# Patient Record
Sex: Male | Born: 1970 | Hispanic: No | Marital: Married | Smoking: Never smoker
Health system: Southern US, Community
[De-identification: ages and names within clinical notes are randomized; demographics above are authoritative.]

## PROBLEM LIST (undated history)

## (undated) DIAGNOSIS — R569 Unspecified convulsions: Secondary | ICD-10-CM

## (undated) DIAGNOSIS — E119 Type 2 diabetes mellitus without complications: Secondary | ICD-10-CM

## (undated) DIAGNOSIS — J449 Chronic obstructive pulmonary disease, unspecified: Secondary | ICD-10-CM

---

## 2020-02-26 DIAGNOSIS — J9621 Acute and chronic respiratory failure with hypoxia: Secondary | ICD-10-CM

## 2020-02-26 DIAGNOSIS — I479 Paroxysmal tachycardia, unspecified: Secondary | ICD-10-CM

## 2020-02-26 DIAGNOSIS — Z930 Tracheostomy status: Secondary | ICD-10-CM

## 2020-02-26 DIAGNOSIS — G9341 Metabolic encephalopathy: Secondary | ICD-10-CM

## 2020-02-26 DIAGNOSIS — R6520 Severe sepsis without septic shock: Secondary | ICD-10-CM

## 2020-02-27 DIAGNOSIS — G9341 Metabolic encephalopathy: Secondary | ICD-10-CM

## 2020-02-27 DIAGNOSIS — I479 Paroxysmal tachycardia, unspecified: Secondary | ICD-10-CM

## 2020-02-27 DIAGNOSIS — R6520 Severe sepsis without septic shock: Secondary | ICD-10-CM

## 2020-02-27 DIAGNOSIS — Z930 Tracheostomy status: Secondary | ICD-10-CM

## 2020-02-27 DIAGNOSIS — J9621 Acute and chronic respiratory failure with hypoxia: Secondary | ICD-10-CM

## 2020-02-28 DIAGNOSIS — I479 Paroxysmal tachycardia, unspecified: Secondary | ICD-10-CM

## 2020-02-28 DIAGNOSIS — Z930 Tracheostomy status: Secondary | ICD-10-CM

## 2020-02-28 DIAGNOSIS — G9341 Metabolic encephalopathy: Secondary | ICD-10-CM

## 2020-02-28 DIAGNOSIS — R6520 Severe sepsis without septic shock: Secondary | ICD-10-CM

## 2020-02-28 DIAGNOSIS — J9621 Acute and chronic respiratory failure with hypoxia: Secondary | ICD-10-CM

## 2020-02-29 DIAGNOSIS — G9341 Metabolic encephalopathy: Secondary | ICD-10-CM

## 2020-02-29 DIAGNOSIS — R6520 Severe sepsis without septic shock: Secondary | ICD-10-CM

## 2020-02-29 DIAGNOSIS — I479 Paroxysmal tachycardia, unspecified: Secondary | ICD-10-CM

## 2020-02-29 DIAGNOSIS — J9621 Acute and chronic respiratory failure with hypoxia: Secondary | ICD-10-CM

## 2020-02-29 DIAGNOSIS — Z930 Tracheostomy status: Secondary | ICD-10-CM

## 2020-03-01 DIAGNOSIS — Z930 Tracheostomy status: Secondary | ICD-10-CM

## 2020-03-01 DIAGNOSIS — R6520 Severe sepsis without septic shock: Secondary | ICD-10-CM

## 2020-03-01 DIAGNOSIS — J9621 Acute and chronic respiratory failure with hypoxia: Secondary | ICD-10-CM

## 2020-03-01 DIAGNOSIS — I479 Paroxysmal tachycardia, unspecified: Secondary | ICD-10-CM

## 2020-03-01 DIAGNOSIS — G9341 Metabolic encephalopathy: Secondary | ICD-10-CM

## 2020-03-02 DIAGNOSIS — Z930 Tracheostomy status: Secondary | ICD-10-CM

## 2020-03-02 DIAGNOSIS — R6520 Severe sepsis without septic shock: Secondary | ICD-10-CM

## 2020-03-02 DIAGNOSIS — I479 Paroxysmal tachycardia, unspecified: Secondary | ICD-10-CM

## 2020-03-02 DIAGNOSIS — J9621 Acute and chronic respiratory failure with hypoxia: Secondary | ICD-10-CM

## 2020-03-02 DIAGNOSIS — G9341 Metabolic encephalopathy: Secondary | ICD-10-CM

## 2020-03-10 DIAGNOSIS — Z930 Tracheostomy status: Secondary | ICD-10-CM

## 2020-03-10 DIAGNOSIS — I479 Paroxysmal tachycardia, unspecified: Secondary | ICD-10-CM

## 2020-03-10 DIAGNOSIS — R6520 Severe sepsis without septic shock: Secondary | ICD-10-CM

## 2020-03-10 DIAGNOSIS — J9621 Acute and chronic respiratory failure with hypoxia: Secondary | ICD-10-CM

## 2020-03-10 DIAGNOSIS — G9341 Metabolic encephalopathy: Secondary | ICD-10-CM

## 2020-03-11 DIAGNOSIS — J9621 Acute and chronic respiratory failure with hypoxia: Secondary | ICD-10-CM

## 2020-03-11 DIAGNOSIS — I479 Paroxysmal tachycardia, unspecified: Secondary | ICD-10-CM

## 2020-03-11 DIAGNOSIS — G9341 Metabolic encephalopathy: Secondary | ICD-10-CM

## 2020-03-11 DIAGNOSIS — Z930 Tracheostomy status: Secondary | ICD-10-CM

## 2020-03-11 DIAGNOSIS — R6520 Severe sepsis without septic shock: Secondary | ICD-10-CM

## 2020-03-12 DIAGNOSIS — R6520 Severe sepsis without septic shock: Secondary | ICD-10-CM | POA: Diagnosis not present

## 2020-03-12 DIAGNOSIS — J9621 Acute and chronic respiratory failure with hypoxia: Secondary | ICD-10-CM | POA: Diagnosis not present

## 2020-03-12 DIAGNOSIS — G9341 Metabolic encephalopathy: Secondary | ICD-10-CM | POA: Diagnosis not present

## 2020-03-12 DIAGNOSIS — Z930 Tracheostomy status: Secondary | ICD-10-CM

## 2020-03-12 DIAGNOSIS — I479 Paroxysmal tachycardia, unspecified: Secondary | ICD-10-CM | POA: Diagnosis not present

## 2020-03-13 DIAGNOSIS — J9621 Acute and chronic respiratory failure with hypoxia: Secondary | ICD-10-CM

## 2020-03-13 DIAGNOSIS — I479 Paroxysmal tachycardia, unspecified: Secondary | ICD-10-CM

## 2020-03-13 DIAGNOSIS — Z930 Tracheostomy status: Secondary | ICD-10-CM

## 2020-03-13 DIAGNOSIS — G9341 Metabolic encephalopathy: Secondary | ICD-10-CM

## 2020-03-13 DIAGNOSIS — R6520 Severe sepsis without septic shock: Secondary | ICD-10-CM

## 2020-10-26 ENCOUNTER — Emergency Department: Payer: Medicaid Other

## 2020-10-26 ENCOUNTER — Other Ambulatory Visit: Payer: Self-pay

## 2020-10-26 ENCOUNTER — Inpatient Hospital Stay
Admission: EM | Admit: 2020-10-26 | Discharge: 2020-10-30 | DRG: 871 | Disposition: A | Discharge status: Skilled Nursing Facility | Payer: Medicaid Other | Attending: Internal Medicine | Admitting: Internal Medicine

## 2020-10-26 ENCOUNTER — Encounter: Payer: Self-pay | Admitting: Emergency Medicine

## 2020-10-26 DIAGNOSIS — Z20822 Contact with and (suspected) exposure to covid-19: Secondary | ICD-10-CM | POA: Diagnosis present

## 2020-10-26 DIAGNOSIS — R6520 Severe sepsis without septic shock: Secondary | ICD-10-CM | POA: Diagnosis present

## 2020-10-26 DIAGNOSIS — L03111 Cellulitis of right axilla: Secondary | ICD-10-CM | POA: Diagnosis present

## 2020-10-26 DIAGNOSIS — Z6820 Body mass index (BMI) 20.0-20.9, adult: Secondary | ICD-10-CM

## 2020-10-26 DIAGNOSIS — R627 Adult failure to thrive: Secondary | ICD-10-CM | POA: Diagnosis present

## 2020-10-26 DIAGNOSIS — N3001 Acute cystitis with hematuria: Secondary | ICD-10-CM | POA: Diagnosis present

## 2020-10-26 DIAGNOSIS — Z9884 Bariatric surgery status: Secondary | ICD-10-CM

## 2020-10-26 DIAGNOSIS — L89150 Pressure ulcer of sacral region, unstageable: Secondary | ICD-10-CM | POA: Diagnosis present

## 2020-10-26 DIAGNOSIS — L732 Hidradenitis suppurativa: Secondary | ICD-10-CM | POA: Diagnosis present

## 2020-10-26 DIAGNOSIS — E440 Moderate protein-calorie malnutrition: Secondary | ICD-10-CM | POA: Diagnosis present

## 2020-10-26 DIAGNOSIS — D649 Anemia, unspecified: Secondary | ICD-10-CM

## 2020-10-26 DIAGNOSIS — K9423 Gastrostomy malfunction: Secondary | ICD-10-CM | POA: Diagnosis present

## 2020-10-26 DIAGNOSIS — N3090 Cystitis, unspecified without hematuria: Secondary | ICD-10-CM

## 2020-10-26 DIAGNOSIS — E1165 Type 2 diabetes mellitus with hyperglycemia: Secondary | ICD-10-CM | POA: Diagnosis not present

## 2020-10-26 DIAGNOSIS — Z7401 Bed confinement status: Secondary | ICD-10-CM

## 2020-10-26 DIAGNOSIS — L03119 Cellulitis of unspecified part of limb: Secondary | ICD-10-CM

## 2020-10-26 DIAGNOSIS — T85598A Other mechanical complication of other gastrointestinal prosthetic devices, implants and grafts, initial encounter: Secondary | ICD-10-CM | POA: Diagnosis present

## 2020-10-26 DIAGNOSIS — L89154 Pressure ulcer of sacral region, stage 4: Secondary | ICD-10-CM | POA: Diagnosis present

## 2020-10-26 DIAGNOSIS — L8990 Pressure ulcer of unspecified site, unspecified stage: Secondary | ICD-10-CM | POA: Insufficient documentation

## 2020-10-26 DIAGNOSIS — D72829 Elevated white blood cell count, unspecified: Secondary | ICD-10-CM | POA: Diagnosis present

## 2020-10-26 DIAGNOSIS — G40909 Epilepsy, unspecified, not intractable, without status epilepticus: Secondary | ICD-10-CM | POA: Diagnosis present

## 2020-10-26 DIAGNOSIS — I959 Hypotension, unspecified: Secondary | ICD-10-CM | POA: Diagnosis not present

## 2020-10-26 DIAGNOSIS — A419 Sepsis, unspecified organism: Principal | ICD-10-CM | POA: Diagnosis present

## 2020-10-26 DIAGNOSIS — J449 Chronic obstructive pulmonary disease, unspecified: Secondary | ICD-10-CM | POA: Diagnosis present

## 2020-10-26 DIAGNOSIS — L03112 Cellulitis of left axilla: Secondary | ICD-10-CM | POA: Diagnosis present

## 2020-10-26 DIAGNOSIS — K219 Gastro-esophageal reflux disease without esophagitis: Secondary | ICD-10-CM | POA: Diagnosis present

## 2020-10-26 DIAGNOSIS — R6889 Other general symptoms and signs: Secondary | ICD-10-CM | POA: Diagnosis present

## 2020-10-26 DIAGNOSIS — S31000A Unspecified open wound of lower back and pelvis without penetration into retroperitoneum, initial encounter: Secondary | ICD-10-CM

## 2020-10-26 HISTORY — DX: Type 2 diabetes mellitus without complications: E11.9

## 2020-10-26 HISTORY — DX: Chronic obstructive pulmonary disease, unspecified: J44.9

## 2020-10-26 HISTORY — DX: Unspecified convulsions: R56.9

## 2020-10-26 LAB — COMPREHENSIVE METABOLIC PANEL
ALT: 80 U/L — ABNORMAL HIGH (ref 0–44)
AST: 34 U/L (ref 15–41)
Albumin: 3.1 g/dL — ABNORMAL LOW (ref 3.5–5.0)
Alkaline Phosphatase: 152 U/L — ABNORMAL HIGH (ref 38–126)
Anion gap: 7 (ref 5–15)
BUN: 75 mg/dL — ABNORMAL HIGH (ref 6–20)
CO2: 31 mmol/L (ref 22–32)
Calcium: 10 mg/dL (ref 8.9–10.3)
Chloride: 100 mmol/L (ref 98–111)
Creatinine, Ser: 0.83 mg/dL (ref 0.61–1.24)
GFR, Estimated: >60 mL/min (ref >60)
Glucose, Bld: 183 mg/dL — ABNORMAL HIGH (ref 70–99)
Potassium: 4.5 mmol/L (ref 3.5–5.1)
Sodium: 138 mmol/L (ref 135–145)
Total Bilirubin: 0.4 mg/dL (ref 0.3–1.2)
Total Protein: 8.9 g/dL — ABNORMAL HIGH (ref 6.5–8.1)

## 2020-10-26 LAB — CBC WITH DIFFERENTIAL/PLATELET
Abs Immature Granulocytes: 0.06 10*3/uL (ref 0.00–0.07)
Basophils Absolute: 0.1 10*3/uL (ref 0.0–0.1)
Basophils Relative: 0 %
Eosinophils Absolute: 0.3 10*3/uL (ref 0.0–0.5)
Eosinophils Relative: 2 %
HCT: 29.6 % — ABNORMAL LOW (ref 39.0–52.0)
Hemoglobin: 9.4 g/dL — ABNORMAL LOW (ref 13.0–17.0)
Immature Granulocytes: 1 %
Lymphocytes Relative: 14 %
Lymphs Abs: 1.7 10*3/uL (ref 0.7–4.0)
MCH: 27.5 pg (ref 26.0–34.0)
MCHC: 31.8 g/dL (ref 30.0–36.0)
MCV: 86.5 fL (ref 80.0–100.0)
Monocytes Absolute: 0.7 10*3/uL (ref 0.1–1.0)
Monocytes Relative: 6 %
Neutro Abs: 9.2 10*3/uL — ABNORMAL HIGH (ref 1.7–7.7)
Neutrophils Relative %: 77 %
Platelets: 243 10*3/uL (ref 150–400)
RBC: 3.42 MIL/uL — ABNORMAL LOW (ref 4.22–5.81)
RDW: 16 % — ABNORMAL HIGH (ref 11.5–15.5)
WBC: 12.1 10*3/uL — ABNORMAL HIGH (ref 4.0–10.5)
nRBC: 0 % (ref 0.0–0.2)

## 2020-10-26 LAB — APTT: aPTT: 31 seconds (ref 24–36)

## 2020-10-26 LAB — PROTIME-INR
INR: 1.1 (ref 0.8–1.2)
Prothrombin Time: 14.5 seconds (ref 11.4–15.2)

## 2020-10-26 LAB — CBG MONITORING, ED: Glucose-Capillary: 194 mg/dL — ABNORMAL HIGH (ref 70–99)

## 2020-10-26 LAB — URINALYSIS, COMPLETE (UACMP) WITH MICROSCOPIC
Bacteria, UA: NONE SEEN
Bilirubin Urine: NEGATIVE
Glucose, UA: NEGATIVE mg/dL
Ketones, ur: NEGATIVE mg/dL
Nitrite: NEGATIVE
Protein, ur: 30 mg/dL — AB
Specific Gravity, Urine: 1.015 (ref 1.005–1.030)
WBC, UA: >50 WBC/hpf — ABNORMAL HIGH (ref 0–5)
pH: 8 (ref 5.0–8.0)

## 2020-10-26 LAB — PROCALCITONIN: Procalcitonin: <0.1 ng/mL

## 2020-10-26 LAB — RESP PANEL BY RT-PCR (FLU A&B, COVID) ARPGX2
Influenza A by PCR: NEGATIVE
Influenza B by PCR: NEGATIVE
SARS Coronavirus 2 by RT PCR: NEGATIVE

## 2020-10-26 LAB — TSH: TSH: 2.833 u[IU]/mL (ref 0.350–4.500)

## 2020-10-26 LAB — LACTIC ACID, PLASMA: Lactic Acid, Venous: 1.1 mmol/L (ref 0.5–1.9)

## 2020-10-26 MED ORDER — MELATONIN 5 MG PO TABS
2.5000 mg | ORAL_TABLET | Freq: Every day | ORAL | Status: DC
  Administered 2020-10-26 – 2020-10-29 (×4): 2.5 mg
  Filled 2020-10-26 (×5): qty 1

## 2020-10-26 MED ORDER — MELATONIN 3 MG PO TABS
3.0000 mg | ORAL_TABLET | Freq: Every day | ORAL | Status: DC
  Filled 2020-10-26: qty 1

## 2020-10-26 MED ORDER — SODIUM BICARBONATE 650 MG PO TABS
650.0000 mg | ORAL_TABLET | Freq: Once | ORAL | Status: AC
  Administered 2020-10-26: 650 mg
  Filled 2020-10-26: qty 1

## 2020-10-26 MED ORDER — SODIUM CHLORIDE 0.9 % IV SOLN
2.0000 g | Freq: Once | INTRAVENOUS | Status: AC
  Administered 2020-10-26: 2 g via INTRAVENOUS
  Filled 2020-10-26: qty 2

## 2020-10-26 MED ORDER — LEVETIRACETAM 100 MG/ML PO SOLN
1000.0000 mg | Freq: Two times a day (BID) | ORAL | Status: DC
  Administered 2020-10-26 – 2020-10-30 (×8): 1000 mg
  Filled 2020-10-26 (×9): qty 10

## 2020-10-26 MED ORDER — THIAMINE HCL 100 MG PO TABS
100.0000 mg | ORAL_TABLET | Freq: Every day | ORAL | Status: DC
  Administered 2020-10-27: 100 mg via ORAL
  Filled 2020-10-26: qty 1

## 2020-10-26 MED ORDER — LEVETIRACETAM 500 MG PO TABS
1000.0000 mg | ORAL_TABLET | Freq: Two times a day (BID) | ORAL | Status: DC
  Filled 2020-10-26 (×2): qty 2

## 2020-10-26 MED ORDER — FAMOTIDINE 20 MG PO TABS
20.0000 mg | ORAL_TABLET | Freq: Every day | ORAL | Status: DC
  Administered 2020-10-27 – 2020-10-30 (×4): 20 mg
  Filled 2020-10-26 (×4): qty 1

## 2020-10-26 MED ORDER — CYANOCOBALAMIN 1000 MCG/ML IJ SOLN
1000.0000 ug | INTRAMUSCULAR | Status: DC
  Administered 2020-10-27: 1000 ug via INTRAMUSCULAR
  Filled 2020-10-26: qty 1

## 2020-10-26 MED ORDER — ACETAMINOPHEN 650 MG RE SUPP: 650.0000 mg | Freq: Four times a day (QID) | RECTAL | Status: DC | PRN

## 2020-10-26 MED ORDER — ENOXAPARIN SODIUM 40 MG/0.4ML ~~LOC~~ SOLN
40.0000 mg | SUBCUTANEOUS | Status: DC
  Administered 2020-10-26 – 2020-10-29 (×4): 40 mg via SUBCUTANEOUS
  Filled 2020-10-26 (×4): qty 0.4

## 2020-10-26 MED ORDER — VANCOMYCIN HCL IN DEXTROSE 1-5 GM/200ML-% IV SOLN
1000.0000 mg | Freq: Two times a day (BID) | INTRAVENOUS | Status: DC
  Administered 2020-10-26: 1000 mg via INTRAVENOUS
  Filled 2020-10-26 (×3): qty 200

## 2020-10-26 MED ORDER — VANCOMYCIN HCL 1250 MG/250ML IV SOLN
1250.0000 mg | Freq: Once | INTRAVENOUS | Status: AC
  Administered 2020-10-26: 1250 mg via INTRAVENOUS
  Filled 2020-10-26: qty 250

## 2020-10-26 MED ORDER — ONDANSETRON HCL 4 MG PO TABS: 4.0000 mg | ORAL_TABLET | Freq: Four times a day (QID) | ORAL | Status: DC | PRN

## 2020-10-26 MED ORDER — LACTATED RINGERS IV BOLUS
1000.0000 mL | Freq: Once | INTRAVENOUS | Status: AC
  Administered 2020-10-26: 1000 mL via INTRAVENOUS

## 2020-10-26 MED ORDER — ACETAMINOPHEN 325 MG PO TABS: 650.0000 mg | ORAL_TABLET | Freq: Four times a day (QID) | ORAL | Status: DC | PRN

## 2020-10-26 MED ORDER — METRONIDAZOLE IN NACL 5-0.79 MG/ML-% IV SOLN
500.0000 mg | Freq: Three times a day (TID) | INTRAVENOUS | Status: DC
  Administered 2020-10-26 – 2020-10-28 (×5): 500 mg via INTRAVENOUS
  Filled 2020-10-26 (×7): qty 100

## 2020-10-26 MED ORDER — LORAZEPAM 2 MG/ML IJ SOLN: 1.0000 mg | INTRAMUSCULAR | Status: DC | PRN

## 2020-10-26 MED ORDER — LACTATED RINGERS IV SOLN: INTRAVENOUS | Status: AC

## 2020-10-26 MED ORDER — SODIUM CHLORIDE 0.9 % IV SOLN
2.0000 g | Freq: Three times a day (TID) | INTRAVENOUS | Status: DC
  Administered 2020-10-26 – 2020-10-28 (×5): 2 g via INTRAVENOUS
  Filled 2020-10-26 (×7): qty 2

## 2020-10-26 MED ORDER — PANCRELIPASE (LIP-PROT-AMYL) 10440-39150 UNITS PO TABS
20880.0000 [IU] | ORAL_TABLET | Freq: Once | ORAL | Status: AC
  Administered 2020-10-26: 20880 [IU]
  Filled 2020-10-26 (×2): qty 2

## 2020-10-26 MED ORDER — ONDANSETRON HCL 4 MG/2ML IJ SOLN: 4.0000 mg | Freq: Four times a day (QID) | INTRAMUSCULAR | Status: DC | PRN

## 2020-10-26 MED ORDER — ACETAMINOPHEN 160 MG/5ML PO SOLN
650.0000 mg | Freq: Four times a day (QID) | ORAL | Status: DC | PRN
  Administered 2020-10-26 – 2020-10-27 (×2): 650 mg via ORAL
  Filled 2020-10-26 (×3): qty 20.3

## 2020-10-26 NOTE — ED Notes (Signed)
Wife at bedside. Attempted to flush g tube without success. Katrinka Blazing, MD aware.

## 2020-10-26 NOTE — ED Notes (Signed)
IV team at bedside 

## 2020-10-26 NOTE — ED Notes (Signed)
G tube still clogged after medication. Katrinka Blazing, MD aware.

## 2020-10-26 NOTE — Progress Notes (Signed)
PHARMACY -  BRIEF ANTIBIOTIC NOTE   Pharmacy has received consult(s) for vancomycin and cefepime from an ED provider.  The patient's profile has been reviewed for ht/wt/allergies/indication/available labs.    One time order(s) placed for:   1) cefepime 2 grams IV x 1  2) vancomycin 1250 mg IV x 1  Further antibiotics/pharmacy consults should be ordered by admitting physician if indicated.                       Thank you, Lowella Bandy 10/26/2020  2:20 PM

## 2020-10-26 NOTE — ED Notes (Signed)
Per Katrinka Blazing, MD patient does not need 2nd set of blood cultures at this time.

## 2020-10-26 NOTE — ED Notes (Signed)
Message sent to pharmacy to get Viokace for g tube.

## 2020-10-26 NOTE — ED Notes (Addendum)
IV attempt by this RN and Katrinka Blazing MD without success. IV team consult placed at this time.

## 2020-10-26 NOTE — ED Provider Notes (Addendum)
[facility] Emergency Department Provider Note  ____________________________________________   Event Date/Time   First MD Initiated Contact with Patient 10/26/20 1203     (approximate)  I have reviewed the triage vital signs and the nursing notes.   HISTORY  Chief Complaint Clogged Feeding Tube   HPI Evan Good is a 50 y.o. male with a past medical history of COPD, DM, seizure disorder and anoxic brain injury nonverbal at baseline T2DM,pyoderma gangrenosum,chronic ulcers,lower extremity sacral decubitus ulcer, s/p gastric bypass, hypoxic brain injury s/p PEG tube and tracheostomy placement 02/01/20 (decannulated 05/14/20) who presents via EMS from nursing facility for assessment of clogged G-tube and low blood pressures.  Patient unable to find any history on arrival as he is unable to state 1 or 2 words but otherwise is nonverbal.  Per EMS his sugars were in the 300s and he was hypotensive with systolics in the 80s but they were unable to establish an IV prior to arrival.  He is reportedly at his neurological baseline.      Past Medical History:  Diagnosis Date  . COPD (chronic obstructive pulmonary disease) (HCC)   . Diabetes mellitus without complication (HCC)   . Seizures (HCC)     There are no problems to display for this patient.   History reviewed. No pertinent surgical history.  Prior to Admission medications   Not on File    Allergies Patient has no known allergies.  No family history on file.  Social History Social History   Tobacco Use  . Smoking status: Never Smoker  Vaping Use  . Vaping Use: Never used  Substance Use Topics  . Alcohol use: Not Currently  . Drug use: Not Currently    Review of Systems  Review of Systems  Unable to perform ROS: Patient nonverbal      ____________________________________________   PHYSICAL EXAM:  VITAL SIGNS: ED Triage Vitals  Enc Vitals Group     BP      Pulse       Resp      Temp      Temp src      SpO2      Weight      Height      Head Circumference      Peak Flow      Pain Score      Pain Loc      Pain Edu?      Excl. in GC?    Vitals:   10/26/20 1430 10/26/20 1530  BP: 118/70 118/70  Pulse: 93 93  Resp: 12 13  Temp:    SpO2: 100% 100%   Physical Exam Vitals and nursing note reviewed.  Constitutional:      General: He is in acute distress.     Appearance: He is ill-appearing.  HENT:     Head: Normocephalic and atraumatic.     Right Ear: External ear normal.     Left Ear: External ear normal.     Nose: Nose normal.     Mouth/Throat:     Mouth: Mucous membranes are dry.  Cardiovascular:     Pulses: Normal pulses.  Pulmonary:     Effort: Pulmonary effort is normal. No respiratory distress.     Breath sounds: No stridor. No wheezing, rhonchi or rales.  Chest:     Chest wall: No tenderness.  Abdominal:     General: Abdomen is flat.  Skin:    General: Skin is warm.  Capillary Refill: Capillary refill takes 2 to 3 seconds.  Neurological:     Mental Status: Mental status is at baseline.     GCS: GCS eye subscore is 4. GCS verbal subscore is 2.     Comments: Patient arms are held fairly rigidly at the sides he does not move his lower extremities on command or move his toes with noxious stimuli     Some skin breakdown to the subcu fat with some purulence in the bilateral axilla.  Stage IV sacral decubitus ulcer with some surrounding necrotic tissue but no gross purulence.  Extremities otherwise unremarkable.  G-tube is in place and surrounding skin is unremarkable.  Lungs clear bilaterally.  Patient is able to track examiner with his eyes and shouts curse words indiscriminately does not answer any questions or otherwise participate in exam.  This is baseline per report. ____________________________________________   LABS (all labs ordered are listed, but only abnormal results are displayed)  Labs Reviewed   COMPREHENSIVE METABOLIC PANEL - Abnormal; Notable for the following components:      Result Value   Glucose, Bld 183 (*)    BUN 75 (*)    Total Protein 8.9 (*)    Albumin 3.1 (*)    ALT 80 (*)    Alkaline Phosphatase 152 (*)    All other components within normal limits  CBC WITH DIFFERENTIAL/PLATELET - Abnormal; Notable for the following components:   WBC 12.1 (*)    RBC 3.42 (*)    Hemoglobin 9.4 (*)    HCT 29.6 (*)    RDW 16.0 (*)    Neutro Abs 9.2 (*)    All other components within normal limits  URINALYSIS, COMPLETE (UACMP) WITH MICROSCOPIC - Abnormal; Notable for the following components:   Color, Urine YELLOW (*)    APPearance CLOUDY (*)    Hgb urine dipstick MODERATE (*)    Protein, ur 30 (*)    Leukocytes,Ua LARGE (*)    WBC, UA >50 (*)    All other components within normal limits  CBG MONITORING, ED - Abnormal; Notable for the following components:   Glucose-Capillary 194 (*)    All other components within normal limits  URINE CULTURE  CULTURE, BLOOD (ROUTINE X 2)  CULTURE, BLOOD (ROUTINE X 2)  RESP PANEL BY RT-PCR (FLU A&B, COVID) ARPGX2  LACTIC ACID, PLASMA  PROTIME-INR  APTT  PROCALCITONIN   ____________________________________________  EKG  Sinus rhythm with a ventricular of 94, normal axis, unremarkable intervals and some artifact in lead I and lead II without any clear evidence of acute ischemia or significant underlying arrhythmia. ____________________________________________  RADIOLOGY  ED MD interpretation: No clear evidence of pneumonia on chest x-ray without full consolidation, effusion, significant edema or other clear acute thoracic process.  CT abdomen pelvis remarkable for small amount of subcu gas along the right buttocks and again consistent with cellulitis.  There is also some evidence of constipation and circumferential thickening of the wall of the rectum somewhat nonspecific.  There is evidence of coronary artery disease and a  nonobstructing right-sided kidney stone.  There is diffuse thickening of the bladder with straight cath consistent with recent and out catheterization.  Official radiology report(s): CT ABDOMEN PELVIS WO CONTRAST  Result Date: 10/26/2020 CLINICAL DATA:  Clogged feeding tube. Rash along the right armpit. Buttock ulceration. Nonlocalized abdominal pain. Hyperglycemia. EXAM: CT ABDOMEN AND PELVIS WITHOUT CONTRAST TECHNIQUE: Multidetector CT imaging of the abdomen and pelvis was performed following the standard protocol without IV contrast.  COMPARISON:  None. FINDINGS: Lower chest: Mild airway thickening in both lower lobes with linear subsegmental atelectasis or scarring in both lower lobes. Right coronary artery atherosclerotic calcification. Hepatobiliary: Nonvisualization of the gallbladder, compatible with gallbladder contraction or prior cholecystectomy. No definite biliary dilatation. Pancreas: Unremarkable Spleen: Unremarkable Adrenals/Urinary Tract: 2 mm right kidney nonobstructive renal calculus. Diffuse wall thickening in the urinary bladder with a trace amount of gas in the urinary bladder. No appreciable hydronephrosis or hydroureter. Stomach/Bowel: Postoperative findings favoring gastric bypass. Peg type tube possibly in a jejunostomy pouch. Correlate with operative history. Prominent stool throughout the colon favors constipation. Circumferential wall thickening in the lower rectum, nonspecific. Potential wall thickening in nondistended portions of the sigmoid colon for example in the right abdomen on image 32 series 5. Vascular/Lymphatic: Bilateral common iliac artery atherosclerotic calcification. Right inguinal node 1.5 cm in short axis on image 91 series 2. Left inguinal lymph node 1.3 cm in short axis on image 89 series 2. Other inguinal lymph nodes in the sub size range are present. Right external iliac node 0.8 cm in short axis on image 78 series 2. Left external iliac node with 0.9 cm in  short axis, image 76 series 2. Left periaortic lymph node 1.0 cm in short axis, image 39 series 2. Reproductive: Unremarkable Other: Diffuse subcutaneous and mesenteric edema. Presacral stranding. Musculoskeletal: Truncated coccygeal segments on image 68 series 6 with surrounding stranding, significance uncertain but inflammation in this vicinity is not excluded. There seem to be small locules of gas along the cutaneous or immediately subcutaneous surface along the right lower back, correlate with any ulceration in this region. IMPRESSION: 1. Small amount of subcutaneous gas along the right upper buttock/lower back region with cutaneous and subcutaneous thickening suggesting cellulitis/infection. This extends down to the sacrococcygeal margin with some truncation of the lower coccyx such that chronic infection/chronic osteomyelitis cannot be excluded. Presacral edema is noted in this region. 2. Peg tube in a pouchlike bowel loop presumably a jejunostomy pouch. Postoperative findings favoring gastric bypass. Correlate with operative history. 3.  Prominent stool throughout the colon favors constipation. 4. Circumferential wall thickening in the lower rectum and in parts of the sigmoid colon, nonspecific. 5. Mild right inguinal adenopathy with upper normal sized left inguinal and external iliac nodes, possibly reactive but technically nonspecific. 6. Airway thickening is present, suggesting bronchitis or reactive airways disease. 7. Right coronary artery atherosclerosis. 8. Nonobstructive right nephrolithiasis. 9. Diffuse wall thickening in the urinary bladder with a trace amount of gas. The gas might possibly be from recent catheterization, correlate with patient history. The urinary bladder wall thickening is suspicious for cystitis. 10. Diffuse subcutaneous and mesenteric edema suggesting third spacing of fluid. Electronically Signed   By: Gaylyn Rong M.D.   On: 10/26/2020 15:38   DG Chest Port 1  View  Result Date: 10/26/2020 CLINICAL DATA:  Clogged feeding tube. On antibiotics for axillary rash. Evaluate for possible sepsis. EXAM: PORTABLE CHEST 1 VIEW COMPARISON:  None FINDINGS: Single frontal view of the chest. The left hand and wrist project over the lower left chest. Patient is rotated to the left. Normal heart size. No pneumothorax. No right-sided pleural fluid. The right lung is clear. The left apex is clear. IMPRESSION: Degraded evaluation of the left hemithorax secondary to patient arm/wrist position. Given this limitation, no evidence of pneumonia or explanation for sepsis. Electronically Signed   By: Jeronimo Greaves M.D.   On: 10/26/2020 13:49    ____________________________________________   PROCEDURES  Procedure(s) performed (  including Critical Care):  Gastrostomy tube replacement  Date/Time: 10/26/2020 3:47 PM Performed by: Gilles Chiquito, MD Authorized by: Gilles Chiquito, MD  Consent given by: power of attorney Local anesthesia used: no  Anesthesia: Local anesthesia used: no  Sedation: Patient sedated: no  Patient tolerance: patient tolerated the procedure well with no immediate complications Comments: Change from 20 [place] to 22  [place] as we do not carry 20 [place] in the ED.This is a J tube not G  .Critical Care Performed by: Gilles Chiquito, MD Authorized by: Gilles Chiquito, MD   Critical care provider statement:    Critical care time (minutes):  45   Critical care was necessary to treat or prevent imminent or life-threatening deterioration of the following conditions:  Sepsis   Critical care was time spent personally by me on the following activities:  Discussions with consultants, evaluation of patient's response to treatment, examination of patient, ordering and performing treatments and interventions, ordering and review of laboratory studies, ordering and review of radiographic studies, pulse oximetry, re-evaluation of patient's condition,  obtaining history from patient or surrogate and review of old charts     ____________________________________________   INITIAL IMPRESSION / ASSESSMENT AND PLAN / ED COURSE        Patient presents with above to history exam with several concerns including clogged G-tube and some low blood pressures.  On arrival patient is noted to be hypotensive with a BP of 87/67 with otherwise stable vital signs on room air.  He has several wounds noted on exam including his bilateral axilla which has some purulent drainage as well as a fairly deep unstageable sacral wound with surrounding discolored tissue but no significant purulence.  With regard to his clogged G-tube attempted to unclog his with protocol enzymes however this was unsuccessful.  This was changed per procedure note above to a 22 [place] G-tube.  This was performed without any difficulty or complication.  With regard to low blood pressure concern for possible dehydration versus sepsis.  No history of recent injuries or falls.  Patient has no fever and his lactic is 1.1 his white blood cell count is elevated at 12.1.  His hemoglobin is 9.4 although looking through his prior records it seems he has chronic anemia with hemoglobins around 10 this does not seem to 4 at baseline.  His urinalysis appears grossly infected with moderate hemoglobin 30 protein and large leukocyte esterase with.  50 WBCs.  This was obtained after a cath sample.  CMP shows a glucose of 183 with no other significant electrolyte or metabolic derangements.  Chest x-ray has no evidence of pneumonia but CT does show findings concerning for cystitis cellulitis from patient's sacral decubitus wounds.  There is also some gas in the bladder which is suspect is from in and out catheterization and tracking along his wounds sacrum which is suspect is likely from chronic cellulitis and osteomyelitis.  Multiple other incidental findings noted above.  Of note the CT was obtained prior to  J-tube replacement.  Given patient was hypotensive on arrival with elevated blood cell count and multiple sites of infection on exam and work-up i.e. bladder infection and concern for possible early or occult sepsis.  He was given 2 L of IV fluid with improvement in his blood pressure.  Was started on broad-spectrum antibiotics and blood and urine cultures were ordered.  Given concern for sepsis I will plan to admit to medicine service for further evaluation and management.  ____________________________________________  FINAL CLINICAL IMPRESSION(S) / ED DIAGNOSES  Final diagnoses:  Sepsis, due to unspecified organism, unspecified whether acute organ dysfunction present [facility])  Acute cystitis with hematuria  Wound of sacral region, initial encounter  Cellulitis of axillary fold  Hypotension, unspecified hypotension type  Anemia, unspecified type  Gastrostomy tube dysfunction (HCC)    Medications  vancomycin (VANCOREADY) IVPB 1250 mg/250 mL (1,250 mg Intravenous New Bag/Given 10/26/20 1526)  lactated ringers bolus 1,000 mL (0 mLs Intravenous Stopped 10/26/20 1432)  lipase/protease/amylase) (VIOKACE) tablets 20,880 Units (20,880 Units Per Tube Given 10/26/20 1339)    And  sodium bicarbonate tablet 650 mg (650 mg Per Tube Given 10/26/20 1339)  ceFEPIme (MAXIPIME) 2 g in sodium chloride 0.9 % 100 mL IVPB (0 g Intravenous Stopped 10/26/20 1458)  lactated ringers bolus 1,000 mL (1,000 mLs Intravenous New Bag/Given 10/26/20 1526)     ED Discharge Orders    None       Note:  This document was prepared using Dragon voice recognition software and may include unintentional dictation errors.   Gilles Chiquito, MD 10/26/20 1557    Gilles Chiquito, MD 10/26/20 [phone]

## 2020-10-26 NOTE — ED Notes (Signed)
Dressing on bottom changed. Patient cleaned and new brief applied.

## 2020-10-26 NOTE — ED Triage Notes (Signed)
Patient arrives from [facility] for a clogged feeding tube. Patient has also had rash on right armpit that he is currently getting antibiotics for. Cbg has been around 350-450 for past few days. Known ulcer on buttocks. Patient is non-verbal at baseline.

## 2020-10-26 NOTE — ED Notes (Signed)
Patient at CT scan.

## 2020-10-26 NOTE — Consult Note (Signed)
Pharmacy Antibiotic Note  Evan Good is a 50 y.o. male admitted on 10/26/2020 with unclear source of infection (c/f cystitis &/or cellulitis from sacral wound).  Pharmacy has been consulted for Vancomycin and Cefepime dosing.  Plan: -- Vancomycin 1250mg  x1 (~19mg /kg); followed by 1g q12h  (~15mg /kg) Predicted AUC 493.7; Cmax 33.1; Cmin 12.8 Scr: 0.83; Vd 0.72; TBW<IBW & est. CrCl 81ml/min (height used was 5'10" from Q4 of 2021)  -- continue with Cefepime  2g q8h  -- Also on Metronidazole 500mg  IV q8H  Weight: 65 kg (143 lb 4.8 oz)  Temp (24hrs), Avg:97.4 F (36.3 C), Min:97.4 F (36.3 C), Max:97.4 F (36.3 C)  Recent Labs  Lab 10/26/20 1220  WBC 12.1*  CREATININE 0.83  LATICACIDVEN 1.1    CrCl cannot be calculated (Unknown ideal weight.).    No Known Allergies  Antimicrobials this admission: Vancomcycin (4/17 >>  Cefepime (4/17 >>  Flagyl (4/17>>  Dose adjustments this admission: n/a  Microbiology results: 4/17 BCx: sent 4/17 UCx: sent  4/17 MRSA PCR: ordered  Thank you for allowing pharmacy to be a part of this patient's care.  5/17 10/26/2020 4:35 PM

## 2020-10-26 NOTE — H&P (Signed)
History and Physical   Michaelle CopasChiemeka Good Louderback WGN:[id number] DOB: [birth year] DOA: 10/26/2020  PCP: Patrecia PourShackleford, Brian Alexander, MD Patient coming from: [place] [facility]  I have personally briefly reviewed patient's old medical records [facility].  Chief Concern: Clogged feeding tube  HPI: Evan Good Doyce Parawangwa is a 50 y.o. male with medical history significant for history of C. 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  Family was not at bedside.  At bedside, patient opens eyes spontaneously, mumbles incoherently occasionally, does track bilaterally with his eyes.  Does not answer questions coherently.  Social history: Unknown  Vaccination status: Unknown  ROS: Unable to complete as patient is nonverbal  ED Course:   Assessment/Plan  Principal Problem:   Sepsis associated hypotension (HCC) Active Problems:   Leukocytosis   Cystitis   Total self-care deficit   Feeding tube blocked   Pressure injury of skin   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)   Meets sepsis criteria with hypotension Source is urinary tract infection versus cellulitis -Elevated respiration rate, hypotensive, WBC -Status post 2 L lactated Ringer's bolus per ED provider -Patient is now maintaining appropriate MAP -Continue LR 100 mL/h, 1 day ordered -Follow blood cultures and urine culture -Status post broad-spectrum antibiotic, cefepime, metronidazole,  vancomycin per ED provider -We will continue these antibiotics -Checking sed rate and CRP -A.m. 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 difficile colitis-low clinical suspicion at this time as patient is not having diarrhea and there is no colitis on CT imaging  Sacral decubitus ulcer-present on admission -In setting of patient chronic with bedbound state and requiring total care -Wound care consult  Requires tube feeding-registered dietitian ordered for enteral/tube feeding initiation and management -I was not able to locate feeding regimen per [facility] E [facility]  Protein and calorie malnutrition Chronic failure to thrive-dietary consulted  Chart reviewed.   DVT prophylaxis: Enoxaparin 40 mg subcutaneous every 24 hours Code Status: Full code, wife says that her husband is a IT sales professionalfighter and would not want to give up Diet: Heart healthy/carb modified Family Communication: Updated spouse, Nettie ElmSylvia over the phone Disposition Plan: Pending clinical course Consults called: None at this time Admission status: Observation to MedSurg, with telemetry  Past Medical History:  Diagnosis Date  . COPD (chronic obstructive pulmonary disease) (HCC)   . Diabetes mellitus without complication (HCC)   . Seizures (HCC)    History reviewed. No pertinent surgical history.  Social History:  reports that he has never smoked. He does not have any smokeless tobacco history on file. He reports previous alcohol use. He reports previous drug  use.  No Known Allergies No family history on file. Family history: Family  history reviewed and not pertinent  Prior to Admission medications   Not on File   Physical Exam: Vitals:   10/26/20 1430 10/26/20 1530 10/26/20 1700 10/26/20 1806  BP: 118/70 118/70 114/69 118/79  Pulse: 93 93 91 91  Resp: 12 13 18 16   Temp:      TempSrc:      SpO2: 100% 100% 100% 100%  Weight:       Constitutional: appears older than chronological age.  Frail, cachectic, failure to thrive Eyes: PERRL, lids and conjunctivae normal ENMT: Mucous membranes are dry. Posterior pharynx clear of any exudate or lesions.  Poor dentition. Hearing appropriate Neck: normal, supple, no masses, no thyromegaly Respiratory: clear to auscultation bilaterally, no wheezing, no crackles. Normal respiratory effort. No accessory muscle use.  Cardiovascular: Regular rate and rhythm, no murmurs / rubs / gallops. No extremity edema. 2+ pedal pulses. No carotid bruits.  Abdomen: Soft, PEG tube in place, no tenderness, no masses palpated, no hepatosplenomegaly. Bowel sounds positive.  Musculoskeletal: no clubbing / cyanosis. No joint deformity upper and lower extremities.  Poor ROM, bilateral upper extremity contractures.  Upper and lower extremity atrophy.  Decreased muscle tones of all extremities. Skin: Bilateral upper axillary rashes, sacral decubitus ulcer Neurologic: Sensation intact. Strength 5/5 in all 4.  Psychiatric: Cannot assess as patient is not able to tell me his name, age, location.  He only spontaneously opens his eyes.  EKG: independently reviewed, showing sinus rhythm with a rate of 94, QTc 444  Chest x-ray on Admission: I personally reviewed and I agree with radiologist reading as below.  CT ABDOMEN PELVIS WO CONTRAST  Result Date: 10/26/2020 CLINICAL DATA:  Clogged feeding tube. Rash along the right armpit. Buttock ulceration. Nonlocalized abdominal pain. Hyperglycemia. EXAM: CT ABDOMEN AND PELVIS WITHOUT  CONTRAST TECHNIQUE: Multidetector CT imaging of the abdomen and pelvis was performed following the standard protocol without IV contrast. COMPARISON:  None. FINDINGS: Lower chest: Mild airway thickening in both lower lobes with linear subsegmental atelectasis or scarring in both lower lobes. Right coronary artery atherosclerotic calcification. Hepatobiliary: Nonvisualization of the gallbladder, compatible with gallbladder contraction or prior cholecystectomy. No definite biliary dilatation. Pancreas: Unremarkable Spleen: Unremarkable Adrenals/Urinary Tract: 2 mm right kidney nonobstructive renal calculus. Diffuse wall thickening in the urinary bladder with a trace amount of gas in the urinary bladder. No appreciable hydronephrosis or hydroureter. Stomach/Bowel: Postoperative findings favoring gastric bypass. Peg type tube possibly in a jejunostomy pouch. Correlate with operative history. Prominent stool throughout the colon favors constipation. Circumferential wall thickening in the lower rectum, nonspecific. Potential wall thickening in nondistended portions of the sigmoid colon for example in the right abdomen on image 32 series 5. Vascular/Lymphatic: Bilateral common iliac artery atherosclerotic calcification. Right inguinal node 1.5 cm in short axis on image 91 series 2. Left inguinal lymph node 1.3 cm in short axis on image 89 series 2. Other inguinal lymph nodes in the sub size range are present. Right external iliac node 0.8 cm in short axis on image 78 series 2. Left external iliac node with 0.9 cm in short axis, image 76 series 2. Left periaortic lymph node 1.0 cm in short axis, image 39 series 2. Reproductive: Unremarkable Other: Diffuse subcutaneous and mesenteric edema. Presacral stranding. Musculoskeletal: Truncated coccygeal segments on image 68 series 6 with surrounding stranding, significance uncertain but inflammation in this vicinity is not excluded. There seem to be small locules of gas along the  cutaneous or immediately subcutaneous surface along the right lower back, correlate with  any ulceration in this region. IMPRESSION: 1. Small amount of subcutaneous gas along the right upper buttock/lower back region with cutaneous and subcutaneous thickening suggesting cellulitis/infection. This extends down to the sacrococcygeal margin with some truncation of the lower coccyx such that chronic infection/chronic osteomyelitis cannot be excluded. Presacral edema is noted in this region. 2. Peg tube in a pouchlike bowel loop presumably a jejunostomy pouch. Postoperative findings favoring gastric bypass. Correlate with operative history. 3.  Prominent stool throughout the colon favors constipation. 4. Circumferential wall thickening in the lower rectum and in parts of the sigmoid colon, nonspecific. 5. Mild right inguinal adenopathy with upper normal sized left inguinal and external iliac nodes, possibly reactive but technically nonspecific. 6. Airway thickening is present, suggesting bronchitis or reactive airways disease. 7. Right coronary artery atherosclerosis. 8. Nonobstructive right nephrolithiasis. 9. Diffuse wall thickening in the urinary bladder with a trace amount of gas. The gas might possibly be from recent catheterization, correlate with patient history. The urinary bladder wall thickening is suspicious for cystitis. 10. Diffuse subcutaneous and mesenteric edema suggesting third spacing of fluid. Electronically Signed   By: Gaylyn Rong M.D.   On: 10/26/2020 15:38   DG Abdomen 1 View  Result Date: 10/26/2020 CLINICAL DATA:  Check jejunostomy catheter placement status post Re-insertion EXAM: ABDOMEN - 1 VIEW COMPARISON:  CT from earlier in the same day. FINDINGS: Jejunostomy catheter is noted. Contrast material injected flows freely into the small bowel. No obstructive changes are noted. IMPRESSION: Jejunostomy catheter in satisfactory position. Electronically Signed   By: Alcide Clever M.D.   On:  10/26/2020 16:35   DG Chest Port 1 View  Result Date: 10/26/2020 CLINICAL DATA:  Clogged feeding tube. On antibiotics for axillary rash. Evaluate for possible sepsis. EXAM: PORTABLE CHEST 1 VIEW COMPARISON:  None FINDINGS: Single frontal view of the chest. The left hand and wrist project over the lower left chest. Patient is rotated to the left. Normal heart size. No pneumothorax. No right-sided pleural fluid. The right lung is clear. The left apex is clear. IMPRESSION: Degraded evaluation of the left hemithorax secondary to patient arm/wrist position. Given this limitation, no evidence of pneumonia or explanation for sepsis. Electronically Signed   By: Jeronimo Greaves M.D.   On: 10/26/2020 13:49   Labs on Admission: I have personally reviewed following labs  CBC: Recent Labs  Lab 10/26/20 1220  WBC 12.1*  NEUTROABS 9.2*  HGB 9.4*  HCT 29.6*  MCV 86.5  PLT 243   Basic Metabolic Panel: Recent Labs  Lab 10/26/20 1220  NA 138  K 4.5  CL 100  CO2 31  GLUCOSE 183*  BUN 75*  CREATININE 0.83  CALCIUM 10.0   GFR: CrCl cannot be calculated (Unknown ideal weight.).  Liver Function Tests: Recent Labs  Lab 10/26/20 1220  AST 34  ALT 80*  ALKPHOS 152*  BILITOT 0.4  PROT 8.9*  ALBUMIN 3.1*   Coagulation Profile: Recent Labs  Lab 10/26/20 1220  INR 1.1   CBG: Recent Labs  Lab 10/26/20 1221  GLUCAP 194*   Lipid Profile: No results for input(s): CHOL, HDL, LDLCALC, TRIG, CHOLHDL, LDLDIRECT in the last 72 hours.  Thyroid Function Tests: Recent Labs    10/26/20 1530  TSH 2.833   Urine analysis:    Component Value Date/Time   COLORURINE YELLOW (A) 10/26/2020 1220   APPEARANCEUR CLOUDY (A) 10/26/2020 1220   LABSPEC 1.015 10/26/2020 1220   PHURINE 8.0 10/26/2020 1220   GLUCOSEU NEGATIVE 10/26/2020 1220  HGBUR MODERATE (A) 10/26/2020 1220   BILIRUBINUR NEGATIVE 10/26/2020 1220   KETONESUR NEGATIVE 10/26/2020 1220   PROTEINUR 30 (A) 10/26/2020 1220   NITRITE  NEGATIVE 10/26/2020 1220   LEUKOCYTESUR LARGE (A) 10/26/2020 1220   Nikita Surman N Nimrod Wendt D.O. [facility]  If 7PM-7AM, please contact overnight-coverage provider If 7AM-7PM, please contact day coverage provider [email or web address]  10/26/2020, 8:21 PM

## 2020-10-27 DIAGNOSIS — E1165 Type 2 diabetes mellitus with hyperglycemia: Secondary | ICD-10-CM | POA: Diagnosis not present

## 2020-10-27 DIAGNOSIS — R627 Adult failure to thrive: Secondary | ICD-10-CM | POA: Diagnosis present

## 2020-10-27 DIAGNOSIS — L732 Hidradenitis suppurativa: Secondary | ICD-10-CM | POA: Diagnosis present

## 2020-10-27 DIAGNOSIS — G40909 Epilepsy, unspecified, not intractable, without status epilepticus: Secondary | ICD-10-CM | POA: Diagnosis present

## 2020-10-27 DIAGNOSIS — R6520 Severe sepsis without septic shock: Secondary | ICD-10-CM | POA: Diagnosis present

## 2020-10-27 DIAGNOSIS — N3001 Acute cystitis with hematuria: Secondary | ICD-10-CM | POA: Diagnosis present

## 2020-10-27 DIAGNOSIS — Z9884 Bariatric surgery status: Secondary | ICD-10-CM | POA: Diagnosis not present

## 2020-10-27 DIAGNOSIS — D649 Anemia, unspecified: Secondary | ICD-10-CM | POA: Diagnosis present

## 2020-10-27 DIAGNOSIS — Z6820 Body mass index (BMI) 20.0-20.9, adult: Secondary | ICD-10-CM | POA: Diagnosis not present

## 2020-10-27 DIAGNOSIS — I959 Hypotension, unspecified: Secondary | ICD-10-CM | POA: Diagnosis present

## 2020-10-27 DIAGNOSIS — E440 Moderate protein-calorie malnutrition: Secondary | ICD-10-CM | POA: Diagnosis present

## 2020-10-27 DIAGNOSIS — L03111 Cellulitis of right axilla: Secondary | ICD-10-CM | POA: Diagnosis present

## 2020-10-27 DIAGNOSIS — L03112 Cellulitis of left axilla: Secondary | ICD-10-CM | POA: Diagnosis present

## 2020-10-27 DIAGNOSIS — Z7401 Bed confinement status: Secondary | ICD-10-CM | POA: Diagnosis not present

## 2020-10-27 DIAGNOSIS — K219 Gastro-esophageal reflux disease without esophagitis: Secondary | ICD-10-CM | POA: Diagnosis present

## 2020-10-27 DIAGNOSIS — L89154 Pressure ulcer of sacral region, stage 4: Secondary | ICD-10-CM | POA: Diagnosis present

## 2020-10-27 DIAGNOSIS — J449 Chronic obstructive pulmonary disease, unspecified: Secondary | ICD-10-CM | POA: Diagnosis present

## 2020-10-27 DIAGNOSIS — K9423 Gastrostomy malfunction: Secondary | ICD-10-CM | POA: Diagnosis present

## 2020-10-27 DIAGNOSIS — A419 Sepsis, unspecified organism: Secondary | ICD-10-CM | POA: Diagnosis present

## 2020-10-27 DIAGNOSIS — R6889 Other general symptoms and signs: Secondary | ICD-10-CM | POA: Diagnosis not present

## 2020-10-27 DIAGNOSIS — Z7189 Other specified counseling: Secondary | ICD-10-CM | POA: Diagnosis not present

## 2020-10-27 DIAGNOSIS — Z20822 Contact with and (suspected) exposure to covid-19: Secondary | ICD-10-CM | POA: Diagnosis present

## 2020-10-27 LAB — CBC
HCT: 26.9 % — ABNORMAL LOW (ref 39.0–52.0)
Hemoglobin: 8.2 g/dL — ABNORMAL LOW (ref 13.0–17.0)
MCH: 26.9 pg (ref 26.0–34.0)
MCHC: 30.5 g/dL (ref 30.0–36.0)
MCV: 88.2 fL (ref 80.0–100.0)
Platelets: 237 10*3/uL (ref 150–400)
RBC: 3.05 MIL/uL — ABNORMAL LOW (ref 4.22–5.81)
RDW: 16.2 % — ABNORMAL HIGH (ref 11.5–15.5)
WBC: 14 10*3/uL — ABNORMAL HIGH (ref 4.0–10.5)
nRBC: 0 % (ref 0.0–0.2)

## 2020-10-27 LAB — PHOSPHORUS: Phosphorus: 4.5 mg/dL (ref 2.5–4.6)

## 2020-10-27 LAB — SEDIMENTATION RATE: Sed Rate: 108 mm/hr — ABNORMAL HIGH (ref 0–15)

## 2020-10-27 LAB — BASIC METABOLIC PANEL
Anion gap: 8 (ref 5–15)
BUN: 56 mg/dL — ABNORMAL HIGH (ref 6–20)
CO2: 27 mmol/L (ref 22–32)
Calcium: 9.3 mg/dL (ref 8.9–10.3)
Chloride: 103 mmol/L (ref 98–111)
Creatinine, Ser: 0.67 mg/dL (ref 0.61–1.24)
GFR, Estimated: >60 mL/min (ref >60)
Glucose, Bld: 178 mg/dL — ABNORMAL HIGH (ref 70–99)
Potassium: 4.6 mmol/L (ref 3.5–5.1)
Sodium: 138 mmol/L (ref 135–145)

## 2020-10-27 LAB — URINE CULTURE: Culture: NO GROWTH

## 2020-10-27 LAB — MAGNESIUM: Magnesium: 2.1 mg/dL (ref 1.7–2.4)

## 2020-10-27 LAB — PROTIME-INR
INR: 1.3 — ABNORMAL HIGH (ref 0.8–1.2)
Prothrombin Time: 15.8 seconds — ABNORMAL HIGH (ref 11.4–15.2)

## 2020-10-27 LAB — HIV ANTIBODY (ROUTINE TESTING W REFLEX): HIV Screen 4th Generation wRfx: NONREACTIVE

## 2020-10-27 LAB — GLUCOSE, CAPILLARY
Glucose-Capillary: 230 mg/dL — ABNORMAL HIGH (ref 70–99)
Glucose-Capillary: 276 mg/dL — ABNORMAL HIGH (ref 70–99)

## 2020-10-27 LAB — PROCALCITONIN: Procalcitonin: <0.1 ng/mL

## 2020-10-27 MED ORDER — ACETAMINOPHEN 160 MG/5ML PO SOLN
650.0000 mg | Freq: Four times a day (QID) | ORAL | Status: DC | PRN
  Administered 2020-10-29: 650 mg
  Filled 2020-10-27 (×2): qty 20.3

## 2020-10-27 MED ORDER — PROSOURCE TF PO LIQD
45.0000 mL | Freq: Two times a day (BID) | ORAL | Status: DC
  Administered 2020-10-27 – 2020-10-30 (×6): 45 mL
  Filled 2020-10-27 (×7): qty 45

## 2020-10-27 MED ORDER — POLYETHYLENE GLYCOL 3350 17 G PO PACK
17.0000 g | PACK | Freq: Two times a day (BID) | ORAL | Status: DC
  Administered 2020-10-29: 17 g
  Filled 2020-10-27 (×2): qty 1

## 2020-10-27 MED ORDER — THIAMINE HCL 100 MG PO TABS
100.0000 mg | ORAL_TABLET | Freq: Every day | ORAL | Status: DC
  Administered 2020-10-28 – 2020-10-30 (×3): 100 mg
  Filled 2020-10-27 (×3): qty 1

## 2020-10-27 MED ORDER — ONDANSETRON HCL 4 MG PO TABS: 4.0000 mg | ORAL_TABLET | Freq: Four times a day (QID) | ORAL | Status: DC | PRN

## 2020-10-27 MED ORDER — BISACODYL 5 MG PO TBEC
10.0000 mg | DELAYED_RELEASE_TABLET | Freq: Every day | ORAL | Status: DC
  Administered 2020-10-27: 10 mg via ORAL
  Filled 2020-10-27: qty 2

## 2020-10-27 MED ORDER — ONDANSETRON HCL 4 MG/2ML IJ SOLN: 4.0000 mg | Freq: Four times a day (QID) | INTRAMUSCULAR | Status: DC | PRN

## 2020-10-27 MED ORDER — POLYETHYLENE GLYCOL 3350 17 G PO PACK
17.0000 g | PACK | Freq: Two times a day (BID) | ORAL | Status: DC
  Administered 2020-10-27: 17 g via ORAL
  Filled 2020-10-27: qty 1

## 2020-10-27 MED ORDER — OSMOLITE 1.5 CAL PO LIQD
1000.0000 mL | ORAL | Status: DC
  Administered 2020-10-27 – 2020-10-30 (×3): 1000 mL

## 2020-10-27 MED ORDER — SODIUM CHLORIDE 0.9 % IV BOLUS
500.0000 mL | Freq: Once | INTRAVENOUS | Status: AC
  Administered 2020-10-27: 500 mL via INTRAVENOUS

## 2020-10-27 MED ORDER — VANCOMYCIN HCL 1000 MG/200ML IV SOLN
1000.0000 mg | Freq: Two times a day (BID) | INTRAVENOUS | Status: DC
  Administered 2020-10-27 – 2020-10-28 (×2): 1000 mg via INTRAVENOUS
  Filled 2020-10-27 (×4): qty 200

## 2020-10-27 NOTE — Progress Notes (Signed)
[facility] Progress Note  Patient: Evan Good    HGD:[id number]  DOA: 10/26/2020     Date of Service: the patient was seen and examined on 10/27/2020  Chief Complaint  Patient presents with  . Clogged Feeding Tube   Brief hospital course: From HPI Evan Good is a 50 y.o. male with medical history significant for history of C. 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  Family was not at bedside.  At bedside, patient opens eyes spontaneously, mumbles incoherently occasionally, does track bilaterally with his eyes.  Does not answer questions coherently.  Social history: Unknown  Vaccination status: Unknown  ROS: Unable to complete as patient is nonverbal  Assessment and Plan: Meets sepsis criteria with hypotension Source is urinary tract infection versus cellulitis -Elevated respiration rate, hypotensive, WBC -Status post 2 L lactated Ringer's bolus per ED provider -Patient is now maintaining appropriate MAP -Continue LR 100 mL/h, 1 day ordered -Follow blood cultures and urine culture -Status post broad-spectrum antibiotic, cefepime, metronidazole, vancomycin per ED provider -We will continue these antibiotics -Checking sed rate and CRP -consulted general surgery for debridement  History of seizure-last known seizure is unknown -Resumed home Keppra 1000 mg twice daily per PEG tube -Ativan 1 mg IV every 4 hours as needed for  seizures, 3 days ordered -Seizure precaution  GERD-resumed famotidine 20 mg per tube, daily Protonix 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 difficile colitis-low clinical suspicion at this time as patient is not having diarrhea and there is no colitis on CT imaging  Sacral decubitus ulcer-present on admission -In setting of patient chronic with bedbound state and requiring total care -Wound care consult  Requires tube feeding-registered dietitian ordered for enteral/tube feeding initiation and management -I was not able to locate feeding regimen per [facility] discharge in E HR  Protein and calorie malnutrition Chronic failure to thrive-dietary consulted  Chart reviewed.    Body mass index is 20.56 kg/m.  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 (Active)  10/26/20 1841  Location: Coccyx  Location Orientation: Mid  Staging: Stage 4 - Full thickness tissue loss with exposed bone, tendon or muscle.  Wound Description (Comments):   Present on Admission: Yes     Diet: Tube feeding DVT Prophylaxis: Subcutaneous Lovenox   Advance goals of care discussion: Full code  Family Communication: family was Not present at bedside, at the time of interview. Pt is nonverbal at baseline   Disposition:  Pt is from [facility], admitted with sepsis due to UTI and sacral ulcer cellulitis, still has cellulitis and UTI, surgical intervention and cultures pending, which precludes a safe discharge. Discharge to [facility] healthcare when cleared by surgery and recovers from infection.    Subjective:  No significant overnight issues, patient was admitted yesterday due to sepsis, UTI and sacral ulcer  and cellulitis.  Patient is nonverbal at baseline, eyes opened and tried to follow the commands, moving left arm but otherwise is contracted. Unable to offer any complaints, seems to be resting comfortably in the bed.   Physical Exam: General:  NAD, laying in bed, nonverbal at baseline .  Eyes: PERRLA ENT: Oral Mucosa Clear, moist  Neck: no JVD,  Cardiovascular: S1 and S2 Present, no Murmur,  Respiratory: good respiratory effort, Bilateral Air entry equal and Decreased, no Crackles, no wheezes Abdomen: Bowel Sound present, Soft and no tenderness,  Skin: , bilateral upper extremity axillary hidradenitis suppurativa, Extremities: no Pedal edema, no calf tenderness Neurologic: Nonverbal, contracted secondary to anoxic brain injury, bedbound at baseline  Vitals:   10/27/20 0505 10/27/20 0824 10/27/20 0946 10/27/20 1621  BP: 100/70 121/79  114/79  Pulse: 77 87  87  Resp:  18  18  Temp:  97.9 F (36.6 C)  98.6 F (37 C)  TempSrc:      SpO2:  99%  100%  Weight:      Height:   5\' 10"  (1.778 m)     Intake/Output Summary (Last 24 hours) at 10/27/2020 1645 Last data filed at 10/27/2020 0411 Gross per 24 hour  Intake 2150.05 ml  Output 400 ml  Net 1750.05 ml   Filed Weights   10/26/20 1216  Weight: 65 kg    Data Reviewed: I have personally reviewed and interpreted daily labs, tele strips, imagings as discussed above. I reviewed all nursing notes, pharmacy notes, vitals, pertinent old records I have discussed plan of care as described above with RN and patient/family.  CBC: Recent Labs  Lab 10/26/20 1220 10/27/20 0342  WBC 12.1* 14.0*  NEUTROABS 9.2*  --   HGB 9.4* 8.2*  HCT 29.6* 26.9*  MCV 86.5 88.2  PLT 243 237   Basic Metabolic Panel: Recent Labs  Lab 10/26/20 1220 10/27/20 0342  NA 138 138  K 4.5 4.6  CL 100 103  CO2 31 27  GLUCOSE 183* 178*  BUN 75* 56*   CREATININE 0.83 0.67  CALCIUM 10.0 9.3  MG  --  2.1  PHOS  --  4.5    Studies: No results found.  Scheduled Meds: . cyanocobalamin  1,000 mcg Intramuscular Q30 days  . enoxaparin (LOVENOX) injection  40 mg Subcutaneous Q24H  . famotidine  20 mg Per Tube Daily  . feeding supplement (PROSource TF)  45 mL Per Tube BID  . levETIRAcetam  1,000 mg Per Tube BID  . melatonin  2.5 mg Per Tube QHS  . polyethylene glycol  17 g Oral BID  . thiamine  100 mg Oral Daily   Continuous Infusions: . ceFEPime (MAXIPIME) IV 2 g (10/27/20 1358)  . feeding supplement (OSMOLITE 1.5 CAL)    . metronidazole 500 mg (10/27/20 1054)  . vancomycin 1,000 mg (10/27/20 1240)   PRN Meds: acetaminophen (TYLENOL) oral liquid 160 mg/5 mL, acetaminophen **OR** acetaminophen, LORazepam, ondansetron **OR** ondansetron (ZOFRAN) IV  Time spent: 35 minutes  Author: 10/29/20. MD [facility] 10/27/2020 4:45 PM  To reach On-call, see care teams to locate the attending and reach out to them via [email or web address]. If 7PM-7AM, please contact night-coverage If you still have difficulty reaching the attending provider, please page the [facility] (Director on Call) for [facility] on amion for assistance.

## 2020-10-27 NOTE — Consult Note (Addendum)
WOC Nurse Consult Note: Reason for Consult: Consult requested for sacrum wound. Pt has a chronic Stage 4 pressure injury; 8X5X.4cm, 80% red and moist, 20% black eschar at lower wound edges. Mod amt blood-tinged drainage.   Right buttock with pinhole full thickness wound draining large amt thick tan pus.  Darker colored skin and induration surrounding over buttock; approx 15X15cm.  When swab is inserted into pinhole opening, there is extensive undermining underneath the skin level.  X-ray indicates a gas collection and possible osteomyelitis; surgical consult is pending. Please refer to their team for further recommendations regarding plan of care.  Pressure Injury POA: Yes Pt has a history of hidradenitis suppurativa; he has multiple shallow full thickness wounds which are draining tan pus to anterior upper groin and bilat underarms.  This complex medical condition is beyond the scope of practice for WOC nurses; please refer to surgical team for further recommendations beyond the Interdry silver-impregenated fabric to absorb drainage and provide antimicrobial benefits.  Dressing procedure/placement/frequency: Pt is very rigid and contracted and it is difficult to reposition him.  Air mattress ordered to reduce pressure.   Topical treatment orders provided for bedside nurses to perform as follows: Measure and cut length of InterDry([place] # [id number])  to fit in skin folds that have skin breakdown  Tuck InterDry fabric into skin folds in a single layer, allow for 2 inches of overhang from skin edges to allow for wicking to occur May remove to bathe; dry area thoroughly and then tuck into affected areas again  Do not apply any creams or ointments when using InterDry DO NOT THROW AWAY FOR 5 DAYS unless soiled with stool DO NOT [facility] product, this will inactivate the silver in the material  New sheet of Interdry should be applied after 5 days of use if patient continues to have skin breakdown Discontinue use of  current sheet after 4/21  Apply Aquacel [place] # [phone]) to sacrum and left buttock wound Q day, then cover with foam dressing.  (Change foam dressing Q 3 days or PRN soiling)  Please re-consult if further assistance is needed.  Thank-you,  Cammie Mcgee MSN, RN, CWOCN, [place], CNS [phone]

## 2020-10-27 NOTE — Consult Note (Signed)
SURGICAL CONSULTATION NOTE   HISTORY OF PRESENT ILLNESS (HPI):  Patient is a poor historian, unable to get history from the patient.  As per discussion with hospitalist and evaluation of the chart I was able to get complete history.  There was no family member at bedside.  50 y.o. male presented to [facility] ED for evaluation of his gastrostomy tube.  Apparently he was found with hypertension and met sepsis criteria.  Suspected sources urinary tract infection.  Patient is married and has a chronic ulcer.  He was consulted to wound care nurses for management.  Wound care nurses recommended surgical evaluation.  Unable to assess age of sacral ulcer.  Patient is unable to express any pain on the ulcer.  There is no pain radiation.  Unable to assess alleviating or aggravating factors.  Surgery is consulted by Dr. Dwyane Dee in this context for evaluation and management of sacral ulcer.  PAST MEDICAL HISTORY (PMH):  Past Medical History:  Diagnosis Date  . COPD (chronic obstructive pulmonary disease) ([place])   . Diabetes mellitus without complication ([place])   . Seizures ([place])      PAST SURGICAL HISTORY ([place]):  History reviewed. No pertinent surgical history.   MEDICATIONS:  Prior to Admission medications   Medication Sig Start Date End Date Taking? Authorizing Provider  ammonium lactate (LAC-HYDRIN) 12 % lotion Apply 1 application topically in the morning and at bedtime. 10/17/20 10/17/21 Yes [provider]  cefdinir (OMNICEF) 250 MG/5ML suspension Place 300 mg into feeding tube 2 (two) times daily. 10/17/20 12/03/20 Yes [provider]  chlorhexidine (PERIDEX) 0.12 % solution 5 mL by Mouth route two (2) times a day. 10/17/20  Yes [provider]  Cholecalciferol 25 MCG (1000 UT) tablet 1,000 Units daily. Via g-tube 10/17/20  Yes [provider]  diclofenac Sodium (VOLTAREN) 1 % GEL Apply topically. 10/17/20 10/17/21 Yes [provider]  famotidine (PEPCID) 20 MG tablet 20  mg daily. Via g-tube 10/18/20  Yes [provider]  folic acid (FOLVITE) 1 MG tablet 1 mg daily. Via g-tube 02/25/20  Yes [provider]  HYDROcodone-acetaminophen (NORCO/VICODIN) 5-325 MG tablet Take 1 tablet by mouth every 12 (twelve) hours as needed for moderate pain or severe pain.   Yes [provider]  insulin glargine (LANTUS) 100 UNIT/ML injection Inject 10 Units into the skin daily. Hold if BG is is less than 125   Yes [provider]  insulin NPH Human (NOVOLIN N) 100 UNIT/ML injection Inject 20 Units into the skin.   Yes [provider]  levETIRAcetam (KEPPRA) 1000 MG tablet 1 tablet (1,000 mg total) by J-Tube route Two (2) times a day. 02/25/20  Yes [provider]  melatonin 3 MG TABS tablet 3 mg at bedtime. 04/05/19  Yes [provider]  Multiple Vitamins-Minerals (SUPER THERA VITE M) TABS Take 1 tablet by mouth daily. Via g-tube 03/20/19  Yes [provider]  polyethylene glycol powder (GLYCOLAX/MIRALAX) 17 GM/SCOOP powder 17 g by G-tube route two (2) times a day as needed (give throught g-tube). 10/17/20  Yes [provider]  thiamine 100 MG tablet 100 mg daily. Via g-tube 02/25/20  Yes [provider]  zinc sulfate 220 (50 Zn) MG capsule 1 capsule (220 mg total) by G-tube route daily. 10/18/20 10/18/21 Yes [provider]     ALLERGIES:  No Known Allergies   SOCIAL HISTORY:  Social History   Socioeconomic History  . Marital status: Married    Spouse name: Not  on file  . Number of children: Not on file  . Years of education: Not on file  . Highest education level: Not on file  Occupational History  . Not on file  Tobacco Use  . Smoking status: Never Smoker  . Smokeless tobacco: Not on file  Vaping Use  . Vaping Use: Never used  Substance and Sexual Activity  . Alcohol use: Not Currently  . Drug use: Not Currently  . Sexual activity: Not Currently  Other Topics Concern  . Not on  file  Social History Narrative  . Not on file   Social Determinants of Health   Financial Resource Strain: Not on file  Food Insecurity: Not on file  Transportation Needs: Not on file  Physical Activity: Not on file  Stress: Not on file  Social Connections: Not on file  Intimate Partner Violence: Not on file      FAMILY HISTORY:  No family history on file.   REVIEW OF SYSTEMS:  Unable to get review of system due to patient baseline mental status  VITAL SIGNS:  Temp:  [97.5 F (36.4 C)-98.6 F (37 C)] 98.6 F (37 C) (04/18 1621) Pulse Rate:  [74-89] 87 (04/18 1621) Resp:  [15-18] 18 (04/18 1621) BP: (86-121)/(61-79) 114/79 (04/18 1621) SpO2:  [97 %-100 %] 100 % (04/18 1621)     Height: _0  (177.8 cm) (per chart review) Weight: 65 kg     INTAKE/OUTPUT:  This shift: No intake/output data recorded.  Last 2 shifts: _1 @   PHYSICAL EXAM:  Constitutional:  --No acute distress, alert Eyes:  -- Pupils equally round and reactive to light  -- No scleral icterus  Ear, nose, and throat:  -- No jugular venous distension  Pulmonary:  -- No crackles  -- Equal breath sounds bilaterally -- Breathing non-labored at rest Cardiovascular:  -- S1, S2 present  -- No pericardial rubs Gastrointestinal:  -- Abdomen soft, nontender, non-distended, no guarding or rebound tenderness -- No abdominal masses appreciated, pulsatile or otherwise  Musculoskeletal and Integumentary:  -- Wounds: Stage II sacral ulcer, no necrotic tissue, no abscess    Neurologic:  -- Motor function: Upper extremity contractions -- Sensation: Unable to assess due to patient mental status   Labs:  CBC Latest Ref Rng & Units 10/27/2020 10/26/2020  WBC 4.0 - 10.5 K/uL 14.0(H) 12.1(H)  Hemoglobin 13.0 - 17.0 g/dL 8.2(L) 9.4(L)  Hematocrit 39.0 - 52.0 % 26.9(L) 29.6(L)  Platelets 150 - 400 K/uL 237 243   CMP Latest Ref Rng & Units 10/27/2020 10/26/2020  Glucose 70 - 99 mg/dL 178(H) 183(H)  BUN  6 - 20 mg/dL 56(H) 75(H)  Creatinine 0.61 - 1.24 mg/dL 0.67 0.83  Sodium 135 - 145 mmol/L 138 138  Potassium 3.5 - 5.1 mmol/L 4.6 4.5  Chloride 98 - 111 mmol/L 103 100  CO2 22 - 32 mmol/L 27 31  Calcium 8.9 - 10.3 mg/dL 9.3 10.0  Total Protein 6.5 - 8.1 g/dL - 8.9(H)  Total Bilirubin 0.3 - 1.2 mg/dL - 0.4  Alkaline Phos 38 - 126 U/L - 152(H)  AST 15 - 41 U/L - 34  ALT 0 - 44 U/L - 80(H)     Imaging studies:  Chest x-ray without significant consolidation or any sign of pneumonia.  Assessment/Plan:  50 y.o. 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  Patient with chronic sacral ulcer.  Patient evaluated at the request of hospitalist and wound care  nurse.  At the moment fibrillation there is no indication for any surgical debridement.  I agree to continue with current local care and following the usual recommendation to decrease the progression of pressure ulcers such as frequent position changes, air bed mattress and improve nutrition.  No surgical management recommended.  Arnold Long, MD

## 2020-10-27 NOTE — Progress Notes (Signed)
Initial Nutrition Assessment  DOCUMENTATION CODES:  Non-severe (moderate) malnutrition in context of chronic illness  INTERVENTION:  Initiate tube feeding via PEJ: Osmolite 1.5 at 60 ml/h (1440 ml per day) Prosource TF 45 ml BID Provides 2240 kcal, 112 gm protein, 1097 ml free water daily - will adjust free water per MD, currently has IVF infusing  NUTRITION DIAGNOSIS:  Moderate Malnutrition related to chronic illness (COPD, chronic ulcers, CHF, gastric bypass) as evidenced by mild fat depletion,severe muscle depletion,mild muscle depletion.  GOAL:  Patient will meet greater than or equal to 90% of their needs  MONITOR:  TF tolerance  REASON FOR ASSESSMENT:  Consult Enteral/tube feeding initiation and management,Assessment of nutrition requirement/status  ASSESSMENT:  Pt admitted with concern for sepsis after initially presenting from facility with a clogged J-tube. Several wounds present on admit and hypotensive. PMH includes DMT2, COPD, chronic ulcers, s/p gastric bypass, CHF, hypoxic brain injury s/p PEG placement. Nonverbal at baseline.  Pt resting in bed at the time of visit. Pt mostly nonverbal, would nod his head at times during interview and smile. Unsure if responses are reliable. Diet is in place and lunch tray noted at bedside. RN reports that pt indicated he did not eat orally, she has not been allowing pt to eat. Noted that at [facility] admission SLP recommended non-oral means of nutrition in February. Pt currently pending surgery consult to evaluate sacral wound and need for I&D. Discussed TF with MD, ok to start feeds if surgery not planning for procedure today. Will also recommend changing to NPO as pt is not safe to swallow.    4/17 - PEJ replaced  Nutritionally Relevant Meds: . cyanocobalamin  1,000 mcg Intramuscular Q30 days  . famotidine  20 mg Per Tube Daily  . polyethylene glycol  17 g Oral BID  . thiamine  100 mg Oral Daily   Continuous Infusions: . ceFEPime  (MAXIPIME) IV 2 g (10/27/20 0617)  . lactated ringers 100 mL/hr at 10/26/20 1838  . vancomycin 1,000 mg (10/26/20 2339)   PRN Meds: ondansetron   Labs reviewed: - SBG ranges 178-183 mg/dL in the last 24 hours. Elevated BUN, 56. Improving.   NUTRITION - FOCUSED PHYSICAL EXAM: Flowsheet Row Most Recent Value  Orbital Region Mild depletion  Upper Arm Region No depletion  Thoracic and Lumbar Region Mild depletion  Buccal Region No depletion  Temple Region Moderate depletion  Clavicle Bone Region Mild depletion  Clavicle and Acromion Bone Region No depletion  Scapular Bone Region No depletion  Dorsal Hand Mild depletion  Patellar Region Severe depletion  Anterior Thigh Region Severe depletion  Posterior Calf Region Severe depletion  Edema (RD Assessment) None  Hair Reviewed  Eyes Reviewed  Mouth Reviewed  Skin Reviewed  Nails Reviewed     Diet Order:   Diet Order            Diet heart healthy/carb modified Room service appropriate? Yes; Fluid consistency: Thin  Diet effective now                EDUCATION NEEDS:  Not appropriate for education at this time  Skin:  Skin Assessment: Skin Integrity Issues: Skin Integrity Issues:: Stage IV,Unstageable Stage IV: Sacrum - 8X5X.4cm, 80% red and moist, 20% black eschar at lower wound edges. Mod amt blood-tinged drainage Unstageable: Right buttock with pinhole full thickness wound draining large amt thick tan pus.  Darker colored skin and induration surrounding over buttock; approx 15X15cm.  Extensive undermining underneath the skin level. - per  WOC RN  Last BM:  4/18 per RN documentation  Height:  Ht Readings from Last 1 Encounters:  10/27/20 5\' 10"  (1.778 m)   Weight:  Wt Readings from Last 1 Encounters:  10/26/20 65 kg   Ideal Body Weight:  75.5 kg / 166 lbs  BMI:  Body mass index is 20.56 kg/m.  Estimated Nutritional Needs:   Kcal:  2000-2200 kcal  Protein:  100-110 g  Fluid:  2L/d  10/28/20, RD,  LDN Clinical Dietitian Pager on Amion

## 2020-10-27 NOTE — Plan of Care (Signed)

## 2020-10-27 NOTE — Progress Notes (Signed)
   10/27/20 1000  Clinical Encounter Type  Visited With Patient  Visit Type Initial  Referral From Chaplain  Consult/Referral To Chaplain  Spiritual Encounters  Spiritual Needs Prayer;Emotional  Chaplain Cameryn Chrisley visited room 145A, Pt Evan Good. Per the medical team notes, Patient arrives from [facility] for a clogged feeding tube" Pt appeared to be non-verbal when I attempted to engage with him.  Pt kept licking his tongue in ane out, so I offered prayer.  I did a universal sign for prayer, so I prayed for the Pt. I provided a ministry of presence, spiritual and emotional support and prayer.

## 2020-10-28 DIAGNOSIS — Z7189 Other specified counseling: Secondary | ICD-10-CM

## 2020-10-28 DIAGNOSIS — R6889 Other general symptoms and signs: Secondary | ICD-10-CM

## 2020-10-28 DIAGNOSIS — E440 Moderate protein-calorie malnutrition: Secondary | ICD-10-CM

## 2020-10-28 DIAGNOSIS — Z515 Encounter for palliative care: Secondary | ICD-10-CM

## 2020-10-28 LAB — CBC
HCT: 29.7 % — ABNORMAL LOW (ref 39.0–52.0)
Hemoglobin: 9.1 g/dL — ABNORMAL LOW (ref 13.0–17.0)
MCH: 27.2 pg (ref 26.0–34.0)
MCHC: 30.6 g/dL (ref 30.0–36.0)
MCV: 88.9 fL (ref 80.0–100.0)
Platelets: 247 10*3/uL (ref 150–400)
RBC: 3.34 MIL/uL — ABNORMAL LOW (ref 4.22–5.81)
RDW: 16 % — ABNORMAL HIGH (ref 11.5–15.5)
WBC: 11.5 10*3/uL — ABNORMAL HIGH (ref 4.0–10.5)
nRBC: 0 % (ref 0.0–0.2)

## 2020-10-28 LAB — BASIC METABOLIC PANEL
Anion gap: 8 (ref 5–15)
BUN: 36 mg/dL — ABNORMAL HIGH (ref 6–20)
CO2: 26 mmol/L (ref 22–32)
Calcium: 9.3 mg/dL (ref 8.9–10.3)
Chloride: 107 mmol/L (ref 98–111)
Creatinine, Ser: 0.49 mg/dL — ABNORMAL LOW (ref 0.61–1.24)
GFR, Estimated: >60 mL/min (ref >60)
Glucose, Bld: 309 mg/dL — ABNORMAL HIGH (ref 70–99)
Potassium: 4.2 mmol/L (ref 3.5–5.1)
Sodium: 141 mmol/L (ref 135–145)

## 2020-10-28 LAB — GLUCOSE, CAPILLARY
Glucose-Capillary: 177 mg/dL — ABNORMAL HIGH (ref 70–99)
Glucose-Capillary: 239 mg/dL — ABNORMAL HIGH (ref 70–99)
Glucose-Capillary: 262 mg/dL — ABNORMAL HIGH (ref 70–99)
Glucose-Capillary: 299 mg/dL — ABNORMAL HIGH (ref 70–99)
Glucose-Capillary: 304 mg/dL — ABNORMAL HIGH (ref 70–99)
Glucose-Capillary: 320 mg/dL — ABNORMAL HIGH (ref 70–99)

## 2020-10-28 LAB — MAGNESIUM: Magnesium: 2 mg/dL (ref 1.7–2.4)

## 2020-10-28 LAB — HEMOGLOBIN A1C
Hgb A1c MFr Bld: 7.3 % — ABNORMAL HIGH (ref 4.8–5.6)
Mean Plasma Glucose: 162.81 mg/dL

## 2020-10-28 LAB — PROCALCITONIN: Procalcitonin: <0.1 ng/mL

## 2020-10-28 LAB — PHOSPHORUS: Phosphorus: 2.7 mg/dL (ref 2.5–4.6)

## 2020-10-28 LAB — HIGH SENSITIVITY CRP: CRP, High Sensitivity: 83.69 mg/L — ABNORMAL HIGH (ref 0.00–3.00)

## 2020-10-28 MED ORDER — HYDROCODONE-ACETAMINOPHEN 5-325 MG PO TABS
1.0000 | ORAL_TABLET | Freq: Two times a day (BID) | ORAL | Status: DC | PRN
  Administered 2020-10-28: 1
  Filled 2020-10-28: qty 1

## 2020-10-28 MED ORDER — INSULIN GLARGINE 100 UNIT/ML ~~LOC~~ SOLN
14.0000 [IU] | Freq: Every day | SUBCUTANEOUS | Status: DC
  Administered 2020-10-28 – 2020-10-29 (×2): 14 [IU] via SUBCUTANEOUS
  Filled 2020-10-28 (×2): qty 0.14

## 2020-10-28 MED ORDER — INSULIN ASPART 100 UNIT/ML ~~LOC~~ SOLN: 0.0000 [IU] | Freq: Three times a day (TID) | SUBCUTANEOUS | Status: DC

## 2020-10-28 MED ORDER — AMMONIUM LACTATE 12 % EX LOTN
1.0000 "application " | TOPICAL_LOTION | Freq: Two times a day (BID) | CUTANEOUS | Status: DC
  Filled 2020-10-28: qty 400

## 2020-10-28 MED ORDER — CEFDINIR 300 MG PO CAPS
300.0000 mg | ORAL_CAPSULE | Freq: Two times a day (BID) | ORAL | Status: DC
  Administered 2020-10-28 – 2020-10-30 (×4): 300 mg
  Filled 2020-10-28 (×5): qty 1

## 2020-10-28 MED ORDER — INSULIN ASPART 100 UNIT/ML ~~LOC~~ SOLN
0.0000 [IU] | SUBCUTANEOUS | Status: DC
  Administered 2020-10-28: 7 [IU] via SUBCUTANEOUS
  Administered 2020-10-28: 3 [IU] via SUBCUTANEOUS
  Administered 2020-10-28: 2 [IU] via SUBCUTANEOUS
  Administered 2020-10-28: 5 [IU] via SUBCUTANEOUS
  Administered 2020-10-29: 3 [IU] via SUBCUTANEOUS
  Administered 2020-10-29 (×2): 2 [IU] via SUBCUTANEOUS
  Administered 2020-10-29: 3 [IU] via SUBCUTANEOUS
  Administered 2020-10-29 (×2): 5 [IU] via SUBCUTANEOUS
  Administered 2020-10-30 (×4): 3 [IU] via SUBCUTANEOUS
  Filled 2020-10-28 (×14): qty 1

## 2020-10-28 MED ORDER — ZINC SULFATE 220 (50 ZN) MG PO CAPS
220.0000 mg | ORAL_CAPSULE | Freq: Every day | ORAL | Status: DC
  Administered 2020-10-28 – 2020-10-30 (×3): 220 mg
  Filled 2020-10-28 (×4): qty 1

## 2020-10-28 MED ORDER — FOLIC ACID 1 MG PO TABS
1.0000 mg | ORAL_TABLET | Freq: Every day | ORAL | Status: DC
  Administered 2020-10-28 – 2020-10-30 (×3): 1 mg
  Filled 2020-10-28 (×3): qty 1

## 2020-10-28 MED ORDER — VITAMIN D 25 MCG (1000 UNIT) PO TABS
1000.0000 [IU] | ORAL_TABLET | Freq: Every day | ORAL | Status: DC
  Administered 2020-10-28 – 2020-10-30 (×3): 1000 [IU]
  Filled 2020-10-28 (×3): qty 1

## 2020-10-28 MED ORDER — CEFDINIR 300 MG PO CAPS
300.0000 mg | ORAL_CAPSULE | Freq: Two times a day (BID) | ORAL | Status: DC
  Administered 2020-10-28: 300 mg via ORAL
  Filled 2020-10-28 (×3): qty 1

## 2020-10-28 MED ORDER — AMOXICILLIN-POT CLAVULANATE 400-57 MG/5ML PO SUSR: 400.0000 mg | Freq: Two times a day (BID) | ORAL | Status: DC

## 2020-10-28 MED ORDER — INSULIN ASPART 100 UNIT/ML ~~LOC~~ SOLN
0.0000 [IU] | Freq: Every day | SUBCUTANEOUS | Status: DC
  Filled 2020-10-28 (×3): qty 1

## 2020-10-28 NOTE — Consult Note (Signed)
Consultation Note Date: 10/28/2020   Patient Name: Evan Good  DOB: 08-15-1970  MRN: [id number]  Age / Sex: 50 y.o., male  PCP: Sheppard Coil Vicie Mutters, MD Referring Physician: Fritzi Mandes, MD  Reason for Consultation: Establishing goals of care  HPI/Patient Profile: 50 y.o. male  with past medical history of C. 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  Being treated for sepsis - UTI vs cellulitis. Also with sacral decubitus ulcer-present on admission. Surgery recommended no surgical intervention. PMT consulted for [place].  Clinical Assessment and Goals of Care: I have reviewed medical records including EPIC notes, labs and imaging, assessed the patient and then met with patient and wife to discuss diagnosis prognosis, GOC, EOL wishes, disposition and options.  Patient makes eye contact, smiles. Unable to communicate further or answer questions.   I introduced Palliative Medicine as specialized medical care for people living with serious illness. It focuses on providing relief from the symptoms and stress of a serious illness. The goal is to improve quality of life for both the patient and the family.  We discussed a brief life review of the patient. Wife tells me they have been married since 2018. He does not have any children. 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 She describes him as smart and full of life.   She  tells me he was at [facility] for about 10 months - had a short dc to a rehab facility in the middle of hospitalization but was quickly readmitted. She tells me about his coma and cardiac arrest during that long hospitalization. She tells me she understands that he will likely live in a facility the rest of his life. He will likely be dependent on tube feeding the rest of his life. She also understands that his cognitive status is probably his new baseline. She tells me she understands is at high risk for ongoing complications and repeated hospitalizations.    We discussed patient's current illness and what it means in the larger context of patient's on-going co-morbidities.  Natural disease trajectory and expectations at EOL were discussed.  I attempted to elicit values and goals of care important to the patient.  She tells me this is not something he ever shared with her in the past.   The difference between aggressive medical intervention and comfort care was considered in light of the patient's goals of care. Advance directives, concepts specific to code status, artificial feeding and hydration, and rehospitalization were considered and discussed.   I completed a MOST form today. The patient and family outlined their wishes for the following treatment decisions:  Cardiopulmonary Resuscitation: Attempt Resuscitation (CPR)  Medical Interventions: Full Scope of Treatment: Use intubation, advanced airway interventions, mechanical ventilation, cardioversion as indicated, medical treatment, IV fluids, etc, also provide comfort measures. Transfer to the hospital if indicated  Antibiotics: Antibiotics if indicated  IV Fluids: IV fluids if indicated  Feeding Tube: Feeding tube long-term if indicated   Encouraged wife to consider DNR/DNI status understanding evidenced based poor outcomes in similar hospitalized patients, as the cause of the arrest is likely associated with chronic/terminal disease rather than a  reversible acute cardio-pulmonary event. She tells me she understands this recommendation and she felt pressure from physicians at [facility] to change his code status to DNR. She tells me she cannot do this as she wants him to live as long as possible - even if that time is on a ventilator or in a worse state than he is now. She understands he could pass away despite CPR attempts - she tells me this is okay because at least she would know everything was attempted to extend his life.    Discussed with wife the importance of continued conversation with family and the medical providers regarding overall plan of care and treatment options, ensuring decisions are within the context of the patient's values and GOCs.    Palliative Care services outpatient were explained and offered. We discussed palliative follow up at SNF. Wife is agreeable to this; however, she does state she would very much like to change his nursing facilities. Discussed I would share this with [facility] team.   Questions and concerns were addressed. The family was encouraged to call with questions or concerns.   Primary Decision Maker NEXT OF KIN - wife Evan Good   SUMMARY OF RECOMMENDATIONS   - full code/full scope - MOST completed to reflect aggressive interventions - TOC aware of wife's request to change facilities - referral given for outpatient palliative to follow up   Code Status/Advance Care Planning:  Full code  Additional Recommendations (Limitations, Scope, Preferences):  Full Scope Treatment  Prognosis:   Unable to determine  Discharge Planning: [facility] for rehab with Palliative care service follow-up      Primary Diagnoses: Present on Admission: . Sepsis associated hypotension ([place]) . Leukocytosis . Total self-care deficit . Feeding tube blocked . Unstageable pressure ulcer of sacral region ([place]) . Sepsis ([place])   I have reviewed the medical record, interviewed the patient and family,  and examined the patient. The following aspects are pertinent.  Past Medical History:  Diagnosis Date  . COPD (chronic obstructive pulmonary disease) ([place])   . Diabetes mellitus without complication ([place])   . Seizures ([place])    Social History   Socioeconomic History  . Marital status: Married    Spouse name: Not on file  . Number of children: Not on file  . Years of education: Not on file  . Highest education level: Not on file  Occupational History  . Not on file  Tobacco Use  . Smoking status: Never Smoker  . Smokeless tobacco: Not on file  Vaping Use  . Vaping Use: Never used  Substance and Sexual Activity  . Alcohol use: Not Currently  . Drug use: Not Currently  . Sexual activity: Not Currently  Other Topics Concern  . Not on file  Social History Narrative  . Not on file   Social Determinants of Health   Financial Resource Strain: Not on file  Food Insecurity: Not on file  Transportation Needs: Not on file  Physical Activity: Not on file  Stress: Not on file  Social Connections: Not on file   No family history on file. Scheduled Meds: . bisacodyl  10 mg Oral QHS  . cefdinir  300 mg Per Tube Q12H  . cholecalciferol  1,000 Units Per Tube Daily  . cyanocobalamin  1,000 mcg Intramuscular Q30 days  . enoxaparin (LOVENOX) injection  40 mg Subcutaneous Q24H  . famotidine  20 mg Per Tube Daily  . feeding supplement (PROSource TF)  45 mL Per Tube BID  .  folic acid  1 mg Per Tube Daily  . insulin aspart  0-5 Units Subcutaneous QHS  . insulin aspart  0-9 Units Subcutaneous Q4H  . insulin glargine  14 Units Subcutaneous Daily  . levETIRAcetam  1,000 mg Per Tube BID  . melatonin  2.5 mg Per Tube QHS  . polyethylene glycol  17 g Per Tube BID  . thiamine  100 mg Per Tube Daily  . zinc sulfate  220 mg Per Tube Daily   Continuous Infusions: . feeding supplement (OSMOLITE 1.5 CAL) Stopped (10/28/20 0000)   PRN Meds:.acetaminophen (TYLENOL) oral liquid 160 mg/5 mL,  acetaminophen **OR** acetaminophen, HYDROcodone-acetaminophen, ondansetron **OR** ondansetron (ZOFRAN) IV No Known Allergies Review of Systems  Unable to perform ROS: Patient nonverbal    Physical Exam Constitutional:      General: He is not in acute distress. Pulmonary:     Effort: Pulmonary effort is normal.  Skin:    General: Skin is warm and dry.  Neurological:     Mental Status: He is alert. He is disoriented.     Vital Signs: BP 111/72 (BP Location: Left Arm)   Pulse 89   Temp 98.7 F (37.1 C)   Resp 18   Ht _0  (1.778 m) Comment: per chart review  Wt 65 kg   SpO2 98%   BMI 20.56 kg/m  Pain Scale: PAINAD   Pain Score: Asleep   SpO2: SpO2: 98 % O2 Device:SpO2: 98 % O2 Flow Rate: .O2 Flow Rate (L/min): 0 L/min  IO: Intake/output summary:   Intake/Output Summary (Last 24 hours) at 10/28/2020 1036 Last data filed at 10/28/2020 2567 Gross per 24 hour  Intake 1246 ml  Output 1150 ml  Net 96 ml    LBM: Last BM Date: 10/27/20 Baseline Weight: Weight: 65 kg Most recent weight: Weight: 65 kg     Palliative Assessment/Data: PPS 30% d/t tube feeding    Time Total: 60 minutes Greater than 50%  of this time was spent counseling and coordinating care related to the above assessment and plan.  Juel Burrow, DNP, AGNP-C Palliative Medicine Team [phone] Pager: [phone]

## 2020-10-28 NOTE — Plan of Care (Signed)

## 2020-10-28 NOTE — Progress Notes (Signed)
Tube feeds stopped

## 2020-10-28 NOTE — Progress Notes (Signed)
[facility]  - [place] at [facility]   PATIENT NAME: Evan Good    MR#:  [id number]  DATE OF BIRTH:  [birth year]  SUBJECTIVE:  patient nonverbal. Smiling when asked question. He blinks his eyes to some of the questions. No new issues per RN. No plans for surgical debridement. Will resume to feeding today. No fever  REVIEW OF SYSTEMS:   Review of Systems  Unable to perform ROS: Language  pt nonverbal due to anoxic brain damage    DRUG ALLERGIES:  No Known Allergies  VITALS:  Blood pressure 111/72, pulse 89, temperature 98.7 F (37.1 C), resp. rate 18, height 5\' 10"  (1.778 m), weight 65 kg, SpO2 98 %.  PHYSICAL EXAMINATION:   Physical Exam General:  NAD, laying in bed, nonverbal at baseline .  Eyes: PERRLA ENT: Oral Mucosa Clear, moist  Neck: no JVD,  Cardiovascular: S1 and S2 Present, no Murmur,  Respiratory: good respiratory effort, Bilateral Air entry equal and Decreased, no Crackles, no wheezes Abdomen: Bowel Sound present, Soft and no tenderness,  Skin: , bilateral upper extremity axillary hidradenitis suppurativa, Extremities: no Pedal edema, no calf tenderness Neurologic: Nonverbal, contracted secondary to anoxic brain injury, bedbound at baseline       LABORATORY PANEL:  CBC Recent Labs  Lab 10/28/20 0512  WBC 11.5*  HGB 9.1*  HCT 29.7*  PLT 247    Chemistries  Recent Labs  Lab 10/26/20 1220 10/27/20 0342 10/28/20 0512  NA 138   < > 141  K 4.5   < > 4.2  CL 100   < > 107  CO2 31   < > 26  GLUCOSE 183*   < > 309*  BUN 75*   < > 36*  CREATININE 0.83   < > 0.49*  CALCIUM 10.0   < > 9.3  MG  --    < > 2.0  AST 34  --   --   ALT 80*  --   --   ALKPHOS 152*  --   --   BILITOT 0.4  --   --    < > = values in this interval not displayed.   Cardiac Enzymes No results for input(s): TROPONINI in the last 168 hours. RADIOLOGY:  DG Abdomen 1 View  Result Date: 10/26/2020 CLINICAL DATA:  Check jejunostomy catheter  placement status post Re-insertion EXAM: ABDOMEN - 1 VIEW COMPARISON:  CT from earlier in the same day. FINDINGS: Jejunostomy catheter is noted. Contrast material injected flows freely into the small bowel. No obstructive changes are noted. IMPRESSION: Jejunostomy catheter in satisfactory position. Electronically Signed   By: 10/28/2020 M.D.   On: 10/26/2020 16:35   ASSESSMENT AND PLAN:  Evan Good a 49 y.o.malewith medical history significant forhistory of C. 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  Sepsis criteria with hypotension, elevated wbc POA--now resolved Source is unclear--could be his skin lesions chronic hidradenitis in both armpits and or chronic decubitus -Elevated respiration rate, hypotensive, WBC -Status post 2 L lactated Ringer's bolus per ED provider -Patient is now maintaining appropriate MAP --d/c IVF, Lactic acid 1.1 - blood  cultures and urine culture--negative -Status post broad-spectrum antibiotic, cefepime, metronidazole, vancomycin per ED provider--change to cefdinir (as a recent recommendation from dermatology at [facility]) -consulted general surgery-- Dr. Hazle Quant does not recommend any debridement. Continue aggressive wound care, air mattress, turn patient frequently.  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 difficile colitis-low clinical suspicion at this time as patient is not having diarrhea and there is no colitis on CT imaging  Chronic Sacral decubitus ulcer-present on admission -In setting of patient chronic with bedbound state and requiring total care -Wound care consult appreciated  Chronic tube feeding-registered dietitian ordered for enteral/tube feeding initiation and management -resumed TF  Protein and calorie malnutrition Chronic failure to thrive-dietary consulted  palliative care consultation appreciated. Patient has a very poor prognosis. He is at a high risk for recurrent admission given his chronic morbidities   Body mass index is 20.56 kg/m.  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 (Active)  10/26/20 1841  Location: Coccyx  Location Orientation: Mid  Staging: Stage 4 - Full thickness tissue loss with exposed bone, tendon or muscle.  Wound Description (Comments):   Present on Admission: Yes     Diet: Tube feeding DVT Prophylaxis: Subcutaneous Lovenox   Advance goals of care discussion: Full code  Family Communication:  discussed with wife Nettie Elm on the phone Disposition:  Pt is from [facility] Discharge to [facility] healthcare tomorrow if continues to remain stable Level of care: Med-Surg Status is: Inpatient  TOC for discharge planning.      TOTAL TIME TAKING CARE OF THIS PATIENT: *30 minutes.  >50% time spent on counselling and coordination of care  Note: This dictation was prepared  with Dragon dictation along with smaller phrase technology. Any transcriptional errors that result from this process are unintentional.  Enedina Finner M.D    [facility]   CC: Primary care physician; Shackleford, Lovell Sheehan, MDPatient ID: Evan Good, male   DOB: 03-13-1971, 50 y.o.   MRN: [id number]

## 2020-10-28 NOTE — Progress Notes (Signed)
Inpatient Diabetes Program Recommendations  AACE/ADA: New Consensus Statement on Inpatient Glycemic Control (2015)  Target Ranges:  Prepandial:   less than 140 mg/dL      Peak postprandial:   less than 180 mg/dL (1-2 hours)      Critically ill patients:  140 - 180 mg/dL   Results for COLTER, MAGOWAN (MRN [id number]) as of 10/28/2020 07:41  Ref. Range 10/27/2020 17:37 10/27/2020 20:17 10/28/2020 00:07 10/28/2020 04:28  Glucose-Capillary Latest Ref Range: 70 - 99 mg/dL 856 (H) 314 (H) 970 (H) 299 (H)    Admit with: Clogged feeding tube/ Sepsis associated hypotension   History: DM, Chronic ulcers, bilateral lower extremity sacral decubitus ulcer, Gastric bypass, Hypoxic brain injury presumed secondary to hypoglycemia/nutritional deficiency in setting of Roux-en-Y gastric bypass surgery  Home DM Meds: Lantus 10 units Daily (hold if CBG <125)       NPH Insulin 20 units into the skin  Current Orders: Novolog Sensitive Correction Scale/ SSI (0-9 units) TID AC + HS    Tube Feeds Stopped at 12am last night (were running 60cc/hr)  Novolog SSi to start this AM--Sent Secure Chat to MD requesting SSI be changed to Q4 hour coverage since pt will be NPO this AM and then Tube feeds likely to resume    --Will follow patient during hospitalization--  Ambrose Finland RN, MSN, CDE Diabetes Coordinator Inpatient Glycemic Control Team Team Pager: [phone] (8a-5p)

## 2020-10-28 NOTE — Progress Notes (Signed)
[facility] Room 145 [facility]) Hospital Liaison RN note:  Received new referral for [facility] out patient palliative program to follow post discharge from [facility], NP. Marice Potter, TOC is aware. Patient information given to referral. Plan is to discharge back to [facility] possibly tomorrow. [facility] Liaison will follow for disposition.  Thank you for this referral.  Cyndra Numbers, RN  [facility] Liaison [phone]

## 2020-10-29 ENCOUNTER — Encounter: Payer: Self-pay | Admitting: Student

## 2020-10-29 ENCOUNTER — Other Ambulatory Visit: Payer: Self-pay

## 2020-10-29 LAB — RESP PANEL BY RT-PCR (FLU A&B, COVID) ARPGX2
Influenza A by PCR: NEGATIVE
Influenza B by PCR: NEGATIVE
SARS Coronavirus 2 by RT PCR: NEGATIVE

## 2020-10-29 LAB — MAGNESIUM: Magnesium: 2.1 mg/dL (ref 1.7–2.4)

## 2020-10-29 LAB — GLUCOSE, CAPILLARY
Glucose-Capillary: 193 mg/dL — ABNORMAL HIGH (ref 70–99)
Glucose-Capillary: 196 mg/dL — ABNORMAL HIGH (ref 70–99)
Glucose-Capillary: 206 mg/dL — ABNORMAL HIGH (ref 70–99)
Glucose-Capillary: 207 mg/dL — ABNORMAL HIGH (ref 70–99)
Glucose-Capillary: 253 mg/dL — ABNORMAL HIGH (ref 70–99)
Glucose-Capillary: 264 mg/dL — ABNORMAL HIGH (ref 70–99)

## 2020-10-29 LAB — PHOSPHORUS: Phosphorus: 2.1 mg/dL — ABNORMAL LOW (ref 2.5–4.6)

## 2020-10-29 MED ORDER — K PHOS MONO-SOD PHOS DI & MONO 155-852-130 MG PO TABS
500.0000 mg | ORAL_TABLET | ORAL | Status: AC
  Administered 2020-10-29 (×2): 500 mg
  Filled 2020-10-29 (×2): qty 2

## 2020-10-29 MED ORDER — SENNOSIDES-DOCUSATE SODIUM 8.6-50 MG PO TABS
2.0000 | ORAL_TABLET | Freq: Every day | ORAL | Status: DC
  Administered 2020-10-29: 2
  Filled 2020-10-29: qty 2

## 2020-10-29 MED ORDER — ACETAMINOPHEN 325 MG PO TABS: 650.0000 mg | ORAL_TABLET | Freq: Four times a day (QID) | ORAL | Status: DC | PRN

## 2020-10-29 MED ORDER — ACETAMINOPHEN 650 MG RE SUPP: 650.0000 mg | Freq: Four times a day (QID) | RECTAL | Status: DC | PRN

## 2020-10-29 MED ORDER — INSULIN GLARGINE 100 UNIT/ML ~~LOC~~ SOLN
20.0000 [IU] | Freq: Every day | SUBCUTANEOUS | Status: DC
  Administered 2020-10-30: 20 [IU] via SUBCUTANEOUS
  Filled 2020-10-29: qty 0.2

## 2020-10-29 MED ORDER — ACETAMINOPHEN 160 MG/5ML PO SOLN: 650.0000 mg | Freq: Four times a day (QID) | ORAL | Status: DC

## 2020-10-29 MED ORDER — ACETAMINOPHEN 160 MG/5ML PO SOLN
650.0000 mg | Freq: Four times a day (QID) | ORAL | Status: DC
  Administered 2020-10-29 – 2020-10-30 (×4): 650 mg
  Filled 2020-10-29 (×6): qty 20.3

## 2020-10-29 NOTE — Plan of Care (Signed)

## 2020-10-29 NOTE — Progress Notes (Signed)
[facility]  - [place] at [facility]   PATIENT NAME: Evan Good    MR#:  [id number]  DATE OF BIRTH:  Nov 16, 1970  SUBJECTIVE:  patient nonverbal. Smiling when asked question. He blinks his eyes to some of the questions. Low grade fever No distress TF going well  REVIEW OF SYSTEMS:   Review of Systems  Unable to perform ROS: Language  pt nonverbal due to anoxic brain damage  DRUG ALLERGIES:  No Known Allergies  VITALS:  Blood pressure 110/71, pulse (!) 115, temperature (!) 100.5 F (38.1 C), temperature source Oral, resp. rate 14, height 5\' 10"  (1.778 m), weight 65 kg, SpO2 100 %.  PHYSICAL EXAMINATION:   Physical Exam General:  NAD, laying in bed, nonverbal at baseline .  Eyes: PERRLA ENT: Oral Mucosa Clear, moist  Neck: no JVD,  Cardiovascular: S1 and S2 Present, no Murmur,  Respiratory: good respiratory effort, Bilateral Air entry equal and Decreased, no Crackles, no wheezes Abdomen: Bowel Sound present, Soft and no tenderness,  Skin: , bilateral upper extremity axillary hidradenitis suppurativa, Extremities: no Pedal edema, no calf tenderness Neurologic: Nonverbal, contracted secondary to anoxic brain injury, bedbound at baseline       LABORATORY PANEL:  CBC Recent Labs  Lab 10/28/20 0512  WBC 11.5*  HGB 9.1*  HCT 29.7*  PLT 247    Chemistries  Recent Labs  Lab 10/26/20 1220 10/27/20 0342 10/28/20 0512 10/29/20 0501  NA 138   < > 141  --   K 4.5   < > 4.2  --   CL 100   < > 107  --   CO2 31   < > 26  --   GLUCOSE 183*   < > 309*  --   BUN 75*   < > 36*  --   CREATININE 0.83   < > 0.49*  --   CALCIUM 10.0   < > 9.3  --   MG  --    < > 2.0 2.1  AST 34  --   --   --   ALT 80*  --   --   --   ALKPHOS 152*  --   --   --   BILITOT 0.4  --   --   --    < > = values in this interval not displayed.   Cardiac Enzymes No results for input(s): TROPONINI in the last 168 hours. RADIOLOGY:  No results found. ASSESSMENT AND  PLAN:  Evan Achilefu Nwangwais a 49 y.o.malewith medical history significant forhistory of C. 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  Sepsis criteria with hypotension, elevated wbc POA--now resolved Source is unclear--could be his skin lesions chronic hidradenitis in both armpits and or chronic decubitus -Elevated respiration rate, hypotensive, WBC -Status post 2 L lactated Ringer's bolus per ED provider -Patient is now maintaining appropriate MAP --d/c IVF, Lactic acid 1.1 - blood cultures and urine culture--negative -Status post broad-spectrum antibiotic, cefepime, metronidazole, vancomycin per ED provider--change to cefdinir till 11/09/2020(as a recent recommendation from dermatology at [facility]) -consulted general surgery-- [facility]. [facility] does not recommend any debridement. Continue aggressive  wound care, air mattress, turn patient frequently.  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 Patient has a very poor prognosis. He is at a high risk for recurrent admission given his chronic morbidities   Body mass index is 20.56 kg/m.  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 (Active)  10/26/20 1841  Location: Coccyx  Location Orientation: Mid  Staging: Stage 4 - Full thickness tissue loss with exposed bone, tendon or muscle.  Wound Description (Comments):   Present on Admission: Yes     Diet: Tube feeding DVT Prophylaxis: Subcutaneous Lovenox   Advance goals of care discussion: Full code  Family Communication:  discussed with wife Nettie Elm on the phone4/20/22 Disposition:  Pt is from [facility] Discharge to [facility] once fever free for 24 hours Level of care: Med-Surg Status is: Inpatient  TOC for discharge planning.      TOTAL TIME TAKING CARE OF THIS PATIENT: *30 minutes.  >50% time spent on counselling and coordination of care  Note: This dictation was prepared with Dragon dictation along with smaller phrase technology. Any transcriptional errors that result from this process are unintentional.  Enedina Finner M.D    [facility]   CC: Primary care physician; Shackleford, Lovell Sheehan, MDPatient ID: Evan Good, male   DOB: 02/10/1971, 50 y.o.   MRN: [id number]

## 2020-10-30 LAB — MAGNESIUM: Magnesium: 2 mg/dL (ref 1.7–2.4)

## 2020-10-30 LAB — GLUCOSE, CAPILLARY
Glucose-Capillary: 208 mg/dL — ABNORMAL HIGH (ref 70–99)
Glucose-Capillary: 218 mg/dL — ABNORMAL HIGH (ref 70–99)
Glucose-Capillary: 224 mg/dL — ABNORMAL HIGH (ref 70–99)
Glucose-Capillary: 226 mg/dL — ABNORMAL HIGH (ref 70–99)

## 2020-10-30 LAB — PHOSPHORUS: Phosphorus: 2.2 mg/dL — ABNORMAL LOW (ref 2.5–4.6)

## 2020-10-30 MED ORDER — VITAMIN B-12 1000 MCG PO TABS: 1000.0000 ug | ORAL_TABLET | Freq: Every day | ORAL | 0 refills | Status: DC

## 2020-10-30 MED ORDER — INSULIN GLARGINE 100 UNIT/ML ~~LOC~~ SOLN: 25.0000 [IU] | Freq: Every day | SUBCUTANEOUS | 11 refills | Status: DC

## 2020-10-30 MED ORDER — OSMOLITE 1.5 CAL PO LIQD: 1000.0000 mL | ORAL | 0 refills | Status: DC

## 2020-10-30 MED ORDER — PROSOURCE TF PO LIQD: 45.0000 mL | Freq: Two times a day (BID) | ORAL | 0 refills | Status: DC

## 2020-10-30 MED ORDER — HYDROCODONE-ACETAMINOPHEN 5-325 MG PO TABS: 1.0000 | ORAL_TABLET | Freq: Two times a day (BID) | ORAL | 0 refills | Status: DC | PRN

## 2020-10-30 MED ORDER — INSULIN GLARGINE 100 UNIT/ML ~~LOC~~ SOLN
25.0000 [IU] | Freq: Every day | SUBCUTANEOUS | Status: DC
  Filled 2020-10-30: qty 0.25

## 2020-10-30 NOTE — Discharge Instructions (Signed)
G Tube care: Flush tube with 30 ml (20 ml if fluid restricted) sterile water every 4 hours, before and after medication administration, and when continuous feeding is interrupted.

## 2020-10-30 NOTE — TOC Progression Note (Signed)
Transition of Care [facility]) - Progression Note    Patient Details  Name: Evan Good MRN: [id number] Date of Birth: 10/27/1970  [facility] of [facility]) CM/SW Contact  Barrie Dunker, RN Phone Number: 10/30/2020, 11:04 AM  Clinical Narrative:   Spoke with the patient's wife and notified her that [facility] will pick the patient up at 430 to go to room 20 B at [facility], she stated understanding         Expected Discharge Plan and Services           Expected Discharge Date: 10/30/20                                     Social Determinants of Health (SDOH) Interventions    Readmission Risk Interventions No flowsheet data found.

## 2020-10-30 NOTE — Discharge Summary (Signed)
[facility] [place] [facility]   PATIENT NAME: Evan Good    MR#:  [id number]  DATE OF BIRTH:  12-29-1970  DATE OF ADMISSION:  10/26/2020 ADMITTING PHYSICIAN: Gillis Santa, MD  DATE OF DISCHARGE: 10/30/2020  PRIMARY CARE PHYSICIAN: Patrecia Pour, MD    ADMISSION DIAGNOSIS:  Sepsis associated hypotension (HCC) [A41.9, I95.9] Cellulitis of axillary fold [L03.119] Gastrostomy tube dysfunction (HCC) [K94.23] Acute cystitis with hematuria [N30.01] Wound of sacral region, initial encounter [S31.000A] Hypotension, unspecified hypotension type [I95.9] Anemia, unspecified type [D64.9] Sepsis, due to unspecified organism, unspecified whether acute organ dysfunction present (HCC) [A41.9] Sepsis (HCC) [A41.9]  DISCHARGE DIAGNOSIS:  Sepsis POA--resolved Chronic decubitus--sacral Bilateral Hidradenitis--arm pits  SECONDARY DIAGNOSIS:   Past Medical History:  Diagnosis Date  . COPD (chronic obstructive pulmonary disease) (HCC)   . Diabetes mellitus without complication (HCC)   . Seizures [facility])     HOSPITAL COURSE:   Evan Achilefu Nwangwais a 50 y.o.malewith medical history significant forhistory of C. 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  Sepsis criteria with hypotension, elevated wbc POA--now resolved Source is unclear--could be his skin lesions chronic hidradenitis in both armpits and or chronic decubitus -Elevated  respiration rate, hypotensive, WBC -Status post 2 L lactated Ringer's bolus per ED provider -Patient is now maintaining appropriate MAP --d/c IVF, Lactic acid 1.1 - blood cultures and urine culture--negative -Status post broad-spectrum antibiotic, cefepime, metronidazole, vancomycin per ED provider--change to cefdinir till 11/09/2020(as a recent recommendation from dermatology at [facility]) -consultedgeneral surgery-- Dr. Hazle Quant does not recommend any debridement. Continue aggressive wound care, air mattress, turn patient frequently. 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 Patient has a very poor prognosis. He is at a high risk for recurrent admission given his chronic morbidities   Body mass index is 20.56 kg/m. 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 (Active)  10/26/20 1841  Location: Coccyx  Location Orientation:  Mid  Staging: Stage 4 - Full thickness tissue loss with exposed bone, tendon or muscle.  Wound Description (Comments):   Present on Admission: Yes    Diet:Tube feeding DVT Prophylaxis:Subcutaneous Lovenox  Advance goals of care discussion:Full code  Family Communication: discussed with wife Nettie Elm on the phone4/21/22 [facility]: [facility]  Level of  care: Med-Surg Status is: Inpatient  TOC for discharge planning.    CONSULTS OBTAINED:    DRUG ALLERGIES:  No Known Allergies  DISCHARGE MEDICATIONS:   Allergies as of 10/30/2020   No Known Allergies     Medication List    STOP taking these medications   insulin NPH Human 100 UNIT/ML injection Commonly known as: NOVOLIN N     TAKE these medications   ammonium lactate 12 % lotion Commonly known as: LAC-HYDRIN Apply 1 application topically in the morning and at bedtime.   cefdinir 250 MG/5ML suspension Commonly known as: OMNICEF Place 300 mg into feeding tube 2 (two) times daily.   chlorhexidine 0.12 % solution Commonly known as: PERIDEX 5 mL by Mouth route two (2) times a day.   Cholecalciferol 25 MCG (1000 UT) tablet 1,000 Units daily. Via g-tube   diclofenac Sodium 1 % Gel Commonly known as: VOLTAREN Apply topically.   famotidine 20 MG tablet Commonly known as: PEPCID 20 mg daily. Via g-tube   feeding supplement (OSMOLITE 1.5 CAL) Liqd Place 1,000 mLs into feeding tube continuous.   feeding supplement (PROSource TF) liquid Place 45 mLs into feeding tube 2 (two) times daily.   folic acid 1 MG tablet Commonly known as: FOLVITE 1 mg daily. Via g-tube   HYDROcodone-acetaminophen 5-325 MG tablet Commonly known as: NORCO/VICODIN Take 1 tablet by mouth every 12 (twelve) hours as needed for moderate pain or severe pain.   insulin glargine 100 UNIT/ML injection Commonly known as: LANTUS Inject 0.25 mLs (25 Units total) into the  skin daily. Start taking on: October 31, 2020 What changed:   how much to take  additional instructions   levETIRAcetam 1000 MG tablet Commonly known as: KEPPRA 1 tablet (1,000 mg total) by J-Tube route Two (2) times a day.   melatonin 3 MG Tabs tablet 3 mg at bedtime.   polyethylene glycol powder 17 GM/SCOOP powder Commonly known as: GLYCOLAX/MIRALAX 17 g by G-tube route two (2) times a day as needed (give throught g-tube).   Super Thera Vite M Tabs Take 1 tablet by mouth daily. Via g-tube   thiamine 100 MG tablet 100 mg daily. Via g-tube   vitamin B-12 1000 MCG tablet Commonly known as: CYANOCOBALAMIN Place 1 tablet (1,000 mcg total) into feeding tube daily.   zinc sulfate 220 (50 Zn) MG capsule 1 capsule (220 mg total) by G-tube route daily.            Discharge Care Instructions  (From admission, onward)         Start     Ordered   10/30/20 0000  Discharge wound care:       Comments: Measure and cut length of InterDry [place] # [id number]) to fit in skin folds that have skin breakdown  Tuck InterDry fabric into skin folds in a single layer, allow for 2 inches of overhang from skin edges to allow for wicking to occur May remove to bathe; dry area thoroughly and then tuck into affected areas again  Do not apply any creams or ointments when using InterDry DO NOT THROW AWAY FOR 5 DAYS unless soiled with stool DO NOT [facility] product, this will inactivate the silver in the material  New sheet of Interdry should be applied after 5 days of use if patient continues to have skin breakdown Discontinue use of current sheet after 4/21   Apply Aquacel [place] # [phone]) to sacrum and left buttock wound Q day, then cover with foam dressing.  (  Change foam dressing Q 3 days or PRN soiling)   10/30/20 1012          If you experience worsening of your admission symptoms, develop shortness of breath, life threatening emergency, suicidal or homicidal thoughts you must seek medical  attention immediately by calling 911 or calling your MD immediately  if symptoms less severe.  You Must read complete instructions/literature along with all the possible adverse reactions/side effects for all the Medicines you take and that have been prescribed to you. Take any new Medicines after you have completely understood and accept all the possible adverse reactions/side effects.   Please note  You were cared for by a hospitalist during your hospital stay. If you have any questions about your discharge medications or the care you received while you were in the hospital after you are discharged, you can call the unit and asked to speak with the hospitalist on call if the hospitalist that took care of you is not available. Once you are discharged, your primary care physician will handle any further medical issues. Please note that NO REFILLS for any discharge medications will be authorized once you are discharged, as it is imperative that you return to your primary care physician (or establish a relationship with a primary care physician if you do not have one) for your aftercare needs so that they can reassess your need for medications and monitor your lab values. Today   SUBJECTIVE   Awake, non verbal Does smile and do eye gestures TF going well No fever No issues per RN  VITAL SIGNS:  Blood pressure 110/68, pulse 100, temperature 98 F (36.7 C), resp. rate 15, height  (1.778 m), weight 65 kg, SpO2 100 %.  I/O:    Intake/Output Summary (Last 24 hours) at 10/30/2020 1014 Last data filed at 10/30/2020 0630 Gross per 24 hour  Intake 1620 ml  Output 1250 ml  Net 370 ml    PHYSICAL EXAMINATION:  General:NAD,laying in bed, nonverbal at baseline.  Eyes:PERRLA VWU:JWJX Mucosa Clear, moist Neck:noJVD,  Cardiovascular:S1 and S2 Present, noMurmur,  Respiratory:goodrespiratory effort, Bilateral Air entry equal and Decreased, noCrackles, nowheezes Abdomen:Bowel  Sound present, Soft andnotenderness,  Skin:, bilateral upper extremity axillary hidradenitis suppurativa, Extremities:noPedal edema, nocalf tenderness, severe contractures Neurologic:Nonverbal, contracted secondary to anoxic brain injury, bedbound at baseline       DATA REVIEW:   CBC  Recent Labs  Lab 10/28/20 0512  WBC 11.5*  HGB 9.1*  HCT 29.7*  PLT 247    Chemistries  Recent Labs  Lab 10/26/20 1220 10/27/20 0342 10/28/20 0512 10/29/20 0501 10/30/20 0503  NA 138   < > 141  --   --   K 4.5   < > 4.2  --   --   CL 100   < > 107  --   --   CO2 31   < > 26  --   --   GLUCOSE 183*   < > 309*  --   --   BUN 75*   < > 36*  --   --   CREATININE 0.83   < > 0.49*  --   --   CALCIUM 10.0   < > 9.3  --   --   MG  --    < > 2.0   < > 2.0  AST 34  --   --   --   --   ALT 80*  --   --   --   --  ALKPHOS 152*  --   --   --   --   BILITOT 0.4  --   --   --   --    < > = values in this interval not displayed.    Microbiology Results   Recent Results (from the past 240 hour(s))  Urine culture     Status: None   Collection Time: 10/26/20 12:20 PM   Specimen: Urine, Random  Result Value Ref Range Status   Specimen Description   Final    URINE, RANDOM Performed at [facility], [address] [place], [place] [address]    Special Requests   Final    NONE Performed at [facility], [address], [place], [place] [address]    Culture   Final    NO GROWTH Performed at [facility], [address], [place], [place] [address]    Report Status 10/27/2020 FINAL  Final  Blood culture (routine x 2)     Status: None (Preliminary result)   Collection Time: 10/26/20 12:20 PM   Specimen: BLOOD  Result Value Ref Range Status   Specimen Description BLOOD Blood Culture adequate volume  Final   Special Requests   Final    BOTTLES DRAWN AEROBIC AND ANAEROBIC RIGHT ANTECUBITAL   Culture   Final    NO GROWTH 4 DAYS Performed at [facility], [address], [place], [place] [address]    Report Status PENDING  Incomplete  Resp Panel by RT-PCR (Flu A&B, Covid) Nasopharyngeal Swab     Status: None   Collection Time: 10/26/20  3:49 PM   Specimen: Nasopharyngeal Swab; Nasopharyngeal(NP) swabs in vial transport medium  Result Value Ref Range Status   SARS Coronavirus 2 by RT PCR NEGATIVE NEGATIVE Final    Comment: (NOTE) SARS-CoV-2 target nucleic acids are NOT DETECTED.  The SARS-CoV-2 RNA is generally detectable in upper respiratory specimens during the acute phase of infection. The lowest concentration of SARS-CoV-2 viral copies this assay can detect is 138 copies/mL. A negative result does not preclude SARS-Cov-2 infection and should not be used as the sole basis for treatment or other patient management decisions. A negative result may occur with  improper specimen collection/handling, submission of specimen other than nasopharyngeal swab, presence of viral mutation(s) within the areas targeted by this assay, and inadequate number of viral copies(<138 copies/mL). A negative result must be combined with clinical observations, patient history, and epidemiological information. The expected result is Negative.  Fact Sheet for Patients:  BloggerCourse.com  Fact Sheet for Healthcare Providers:  SeriousBroker.it  This test is no t yet approved or cleared by the [place] FDA and  has been authorized for detection and/or diagnosis of SARS-CoV-2 by FDA under an Emergency Use Authorization (EUA). This EUA will remain  in effect (meaning this test can be used) for the duration of the COVID-19 declaration under Section 564(b)(1) of the Act, 21 U.S.C.section 360bbb-3(b)(1), unless the authorization is terminated  or revoked sooner.       Influenza A by PCR NEGATIVE NEGATIVE Final   Influenza B by PCR NEGATIVE NEGATIVE Final    Comment: (NOTE) The Xpert Xpress  SARS-CoV-2/FLU/RSV plus assay is intended as an aid in the diagnosis of influenza from Nasopharyngeal swab specimens and should not be used as a sole basis for treatment. Nasal washings and aspirates are unacceptable for Xpert Xpress SARS-CoV-2/FLU/RSV testing.  Fact Sheet for Patients: BloggerCourse.com  Fact Sheet for Healthcare Providers: SeriousBroker.it  This test is not yet approved or cleared by the [place] FDA and has been authorized for detection and/or diagnosis of SARS-CoV-2 by FDA under an Emergency Use Authorization (EUA). This EUA will remain in effect (meaning this test can be used) for the duration of the COVID-19 declaration under Section 564(b)(1) of the Act, 21 U.S.C. section 360bbb-3(b)(1), unless the authorization is terminated or revoked.  Performed at [facility], [address] [place], [place] [address]   Resp Panel by RT-PCR (Flu A&B, Covid) Nasopharyngeal Swab     Status: None   Collection Time: 10/29/20  9:03 AM   Specimen: Nasopharyngeal Swab; Nasopharyngeal(NP) swabs in vial transport medium  Result Value Ref Range Status   SARS Coronavirus 2 by RT PCR NEGATIVE NEGATIVE Final    Comment: (NOTE) SARS-CoV-2 target nucleic acids are NOT DETECTED.  The SARS-CoV-2 RNA is generally detectable in upper respiratory specimens during the acute phase of infection. The lowest concentration of SARS-CoV-2 viral copies this assay can detect is 138 copies/mL. A negative result does not preclude SARS-Cov-2 infection and should not be used as the sole basis for treatment or other patient management decisions. A negative result may occur with  improper specimen collection/handling, submission of specimen other than nasopharyngeal swab, presence of viral mutation(s) within the areas targeted by this assay, and inadequate number of viral copies(<138 copies/mL). A negative result must be combined  with clinical observations, patient history, and epidemiological information. The expected result is Negative.  Fact Sheet for Patients:  BloggerCourse.comhttps://www.fda.gov/media/152166/download  Fact Sheet for Healthcare Providers:  SeriousBroker.ithttps://www.fda.gov/media/152162/download  This test is no t yet approved or cleared by the [place] FDA and  has been authorized for detection and/or diagnosis of SARS-CoV-2 by FDA under an Emergency Use Authorization (EUA). This EUA will remain  in effect (meaning this test can be used) for the duration of the COVID-19 declaration under Section 564(b)(1) of the Act, 21 U.S.C.section 360bbb-3(b)(1), unless the authorization is terminated  or revoked sooner.       Influenza A by PCR NEGATIVE NEGATIVE Final   Influenza B by PCR NEGATIVE NEGATIVE Final    Comment: (NOTE) The Xpert Xpress SARS-CoV-2/FLU/RSV plus assay is intended as an aid in the diagnosis of influenza from Nasopharyngeal swab specimens and should not be used as a sole basis for treatment. Nasal washings and aspirates are unacceptable for Xpert Xpress SARS-CoV-2/FLU/RSV testing.  Fact Sheet for Patients: BloggerCourse.comhttps://www.fda.gov/media/152166/download  Fact Sheet for Healthcare Providers: SeriousBroker.ithttps://www.fda.gov/media/152162/download  This test is not yet approved or cleared by the [place] FDA and has been authorized for detection and/or diagnosis of SARS-CoV-2 by FDA under an Emergency Use Authorization (EUA). This EUA will remain in effect (meaning this test can be used) for the duration of the COVID-19 declaration under Section 564(b)(1) of the Act, 21 U.S.C. section 360bbb-3(b)(1), unless the authorization is terminated or revoked.  Performed at [facility], [address] [place], [place] [address]     RADIOLOGY:  No results found.   CODE STATUS:     Code Status Orders  (From admission, onward)         Start     Ordered   10/26/20 1613  Full code  Continuous         10/26/20 1614        Code Status History    This patient has a current code status but no historical code status.   Advance Care Planning Activity       TOTAL  TIME TAKING CARE OF THIS PATIENT: *35 minutes.    Enedina Finner M.D  [facility]    CC: Primary care physician; Shackleford, Lovell Sheehan, MD

## 2020-10-30 NOTE — Progress Notes (Signed)
Pt discharged to [facility] via [employer] [facility] services. Report has been called.   10/30/20 1551  Vitals  Temp 98.1 F (36.7 C)  BP 102/69  MAP (mmHg) 81  BP Location Right Arm  BP Method Automatic  Patient Position (if appropriate) Lying  Pulse Rate 98  Resp 18  MEWS COLOR  MEWS Score Color Green  Oxygen Therapy  SpO2 98 %  O2 Device Room [employer]

## 2020-10-30 NOTE — Progress Notes (Signed)
Report called to [employer]. Report given by this RN to Charlcie Cradle, LPN. All questions were answered. Pt ready for discharge.

## 2020-10-31 ENCOUNTER — Non-Acute Institutional Stay: Payer: Medicaid Other | Admitting: Nurse Practitioner

## 2020-10-31 ENCOUNTER — Encounter: Payer: Self-pay | Admitting: Nurse Practitioner

## 2020-10-31 VITALS — BP 106/63 | HR 83 | Temp 98.6°F | Resp 18 | Wt 161.0 lb

## 2020-10-31 DIAGNOSIS — Z515 Encounter for palliative care: Secondary | ICD-10-CM

## 2020-10-31 DIAGNOSIS — R5381 Other malaise: Secondary | ICD-10-CM

## 2020-10-31 LAB — CULTURE, BLOOD (ROUTINE X 2)
Culture: NO GROWTH
Specimen Description: ADEQUATE

## 2020-10-31 NOTE — Progress Notes (Signed)
[facility] Palliative Care Consult Note Telephone: [phone]  Fax: [phone]    Date of encounter: 10/31/20 PATIENT NAME: Evan Good [address] [place] [address]   [phone] (home)  DOB: 1970/12/04 MRN: [id number] PRIMARY CARE PROVIDER:   Dr [facility], Vicie Mutters, MD,  [place] [address] [phone]  RESPONSIBLE PARTY:    Contact Information    Name Relation Home Work [place], [place]       I met face to face with patient facility. Palliative Care was asked to follow this patient by consultation request of  Shackleford, Yates Decamp* to address advance care planning and complex medical decision making. This is the initial visit.   ASSESSMENT AND PLAN / RECOMMENDATIONS:   Advance Care Planning/Goals of Care: Goals include to maximize quality of life and symptom management. Our advance care planning conversation included a discussion about:     The value and importance of advance care planning   Experiences with loved ones who have been seriously ill or have died   Exploration of personal, cultural or spiritual beliefs that might influence medical decisions   Exploration of goals of care in the event of a sudden injury or illness   Identification and preparation of a healthcare agent   Review and updating or creation of an  advance directive document .  Decision not to resuscitate or to de-escalate disease focused treatments due to poor prognosis.  CODE STATUS: Full code  Symptom Management/Plan: 1. ACP; Full code with full scope of treatment  2. 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 Palliative care encounter; Palliative care encounter; Palliative medicine team will continue to support patient, patient's family, and medical team. Visit consisted of counseling and education dealing with the complex and emotionally intense issues of symptom management and palliative care in the setting of serious and potentially life-threatening illness  Follow up Palliative Care Visit: Palliative care will continue to follow for complex medical decision making, advance care planning, and clarification of goals. Return 1 weeks or prn.  I spent 45 minutes providing this consultation. More than 50% of the time in this consultation was spent in counseling and care coordination.  PPS: 30%  HOSPICE ELIGIBILITY/DIAGNOSIS: TBD  Chief Complaint: palliative consult with complex medical decision making  HISTORY OF PRESENT ILLNESS:  Evan Good is a 50 y.o. 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 Hospitalised 10/26/2020 to 10/30/2020 for Sepsis with hypertension elevated WBC source was unclear could be skin lesions chronic hidradenitis in both armpits And or chronic decubitis. History of seizures resumed Keppra. On insulin therapy for diabetes. Chronic sacral decubitis. Chronic tube feeding. Palliative console completed high risk for recurrent admissions giving chronic comorbidities with poor prognosis. Palliative did see during hospitalization for 10/28/2020 Per documentation he was at [facility] for about 10 months discharged to rehab facility in the middle of the hospitalization  was readmitted, cardiac arrest  and coma. MOST form completed with wishes for full code, aggressive interventions, full scope of treatment. Evan Good was discharged to [place] at [facility]. Staff endorses Evan. Good requires total care for mobility, transfers as he is hoyer to recliner chair, turning, positioning, bathing, dressing, incontinence bowel and bladder. Fed though tube feedings essential to sustain life. At present Evan. Good is lying in bed, appears severely debilitated, chronically ill, no distress. No visitors present. I visited and observed Evan. Good. Evan Good made eye contact, smiling. Evan. Good did not answer questions when asked, just nodded his head yes throughout [facility] visit. No meaningful discussions due to cognitive impairment. Emotional support provided. I have attempted to contact Evan. Good wife for further discussion of goc, further discussion of hx, further planning and wishes. Updated staff. Medical goals reviewed.   History obtained from review of EMR, discussion with primary team, and interview with facility staff and  Evan. Good.  I reviewed available labs, medications, imaging, studies and related documents from the EMR.  Records reviewed and summarized above.   ROS Full 14 system review of systems performed and negative with exception of: as per HPI.  Physical Exam: Constitutional: NAD General: chronically ill, debilitated, male EYES: lids intact ENMT: oral mucous membranes moist CV: S1S2, RRR, no LE edema Pulmonary: decrease breath sounds throughout, no increased work of breathing Abdomen: inormo-active BS + 4 quadrants, soft and non tender GU: deferred MSK: bed-bound Skin: warm and dry, decubitus sacrum Neuro:  + generalized weakness,  + cognitive impairment Psych: alert, smiling  CURRENT PROBLEM LIST:  Patient Active Problem List   Diagnosis Date Noted  . Malnutrition of moderate degree 10/28/2020  . Sepsis ([place]) 10/27/2020  . Sepsis associated  hypotension ([place]) 10/26/2020  . Leukocytosis 10/26/2020  . Cystitis 10/26/2020  . Total self-care deficit 10/26/2020  . Feeding tube blocked 10/26/2020  . Pressure injury of skin 10/26/2020  . Unstageable pressure ulcer of sacral region ([place]) 10/26/2020   PAST MEDICAL HISTORY:  Active Ambulatory Problems    Diagnosis Date Noted  . Sepsis associated hypotension ([place]) 10/26/2020  . Leukocytosis 10/26/2020  . Cystitis 10/26/2020  . Total self-care deficit 10/26/2020  . Feeding tube blocked 10/26/2020  . Pressure injury of skin 10/26/2020  . Unstageable pressure ulcer of sacral region ([place]) 10/26/2020  . Sepsis ([place]) 10/27/2020  . Malnutrition of moderate degree 10/28/2020   Resolved Ambulatory Problems    Diagnosis Date Noted  . No Resolved Ambulatory Problems   Past Medical History:  Diagnosis Date  . COPD (chronic obstructive pulmonary disease) ([place])   . Diabetes mellitus without complication ([place])   . Seizures ([place])    SOCIAL HX:  Social History   Tobacco Use  . Smoking status: Never Smoker  . Smokeless tobacco: Never Used  Substance Use Topics  . Alcohol use: Not Currently   FAMILY HX: History reviewed. No pertinent family history.  reviewed ALLERGIES: No Known Allergies    Questions and concerns were addressed with staff. Provided general support and encouragement, no other unmet needs identified  Thank you for the opportunity to participate in the care of Evan. Kavanagh.  The palliative care team will continue to follow. Please call our office at [phone] if we can be of additional assistance.   This chart was dictated using voice recognition software. Despite best efforts to proofread, errors can occur which can change the documentation meaning.  Conna Terada Ihor Gully, NP , MSN, [facility]

## 2020-10-31 NOTE — Progress Notes (Signed)
   10/29/20 0905  Assess: MEWS Score  Temp (!) 101.1 F (38.4 C)  BP 105/75  Pulse Rate (!) 112  Resp 18  SpO2 100 %  Assess: MEWS Score  MEWS Temp 1  MEWS Systolic 0  MEWS Pulse 2  MEWS RR 0  MEWS LOC 0  MEWS Score 3  MEWS Score Color Yellow  Assess: if the MEWS score is Yellow or Red  Were vital signs taken at a resting state? Yes  Focused Assessment No change from prior assessment  Early Detection of Sepsis Score *See Row Information* High  MEWS guidelines implemented *See Row Information* Yes  Treat  Pain Scale PAINAD  Pain Score 0  Faces Pain Scale 0  Breathing 0  Negative Vocalization 0  Facial Expression 0  Body Language 0  Consolability 0  PAINAD Score 0  Take Vital Signs  Increase Vital Sign Frequency  Yellow: Q 2hr X 2 then Q 4hr X 2, if remains yellow, continue Q 4hrs  Escalate  MEWS: Escalate Yellow: discuss with charge nurse/RN and consider discussing with provider and RRT  Notify: Charge Nurse/RN  Name of Charge Nurse/RN Notified Teresa, RN  Date Charge Nurse/RN Notified 10/29/20  Time Charge Nurse/RN Notified 0845  Notify: Provider  Provider Name/Title Enedina Finner, MD  Date Provider Notified 10/29/20  Time Provider Notified [phone]  Notification Type Page  Notification Reason Change in status  Provider response At bedside  Date of Provider Response 10/29/20  Time of Provider Response 0830  Inserted for Joanna Puff RN

## 2020-11-03 ENCOUNTER — Other Ambulatory Visit: Payer: Self-pay

## 2020-11-03 ENCOUNTER — Non-Acute Institutional Stay: Payer: Medicaid Other | Admitting: Nurse Practitioner

## 2020-11-03 ENCOUNTER — Encounter: Payer: Self-pay | Admitting: Nurse Practitioner

## 2020-11-03 VITALS — BP 117/68 | HR 89 | Temp 97.5°F | Resp 18 | Wt 161.0 lb

## 2020-11-03 DIAGNOSIS — Z515 Encounter for palliative care: Secondary | ICD-10-CM

## 2020-11-03 DIAGNOSIS — R5381 Other malaise: Secondary | ICD-10-CM

## 2020-11-03 DIAGNOSIS — E440 Moderate protein-calorie malnutrition: Secondary | ICD-10-CM

## 2020-11-03 NOTE — Progress Notes (Signed)
  [facility] Palliative Care Consult Note Telephone: [phone]  Fax: [phone]    Date of encounter: 11/03/20 PATIENT NAME: Evan Good [address] [place] [address]   [phone] (home)  DOB: 12/03/1970 MRN: [id number] PRIMARY CARE PROVIDER:   Dr Amin/[facility] Shackleford, Brian Alexander, MD,  [address] [place] [address] [phone]  RESPONSIBLE PARTY:    Contact Information    Name Relation Home Work Mobile   Rochon, SYLVIA  [phone]       I met face to face with patient and wife in facility. Palliative Care was asked to follow this patient by consultation request of  Dr Amin, to address advance care planning and complex medical decision making. This is a follow up visit  ASSESSMENT AND PLAN / RECOMMENDATIONS:   Advance Care Planning/Goals of Care: Goals include to maximize quality of life and symptom management. Our advance care planning conversation included a discussion about:     The value and importance of advance care planning   Experiences with loved ones who have been seriously ill or have died   Exploration of personal, cultural or spiritual beliefs that might influence medical decisions   Exploration of goals of care in the event of a sudden injury or illness   Identification and preparation of a healthcare agent   Review and updating or creation of an  advance directive document .  Decision not to resuscitate or to de-escalate disease focused treatments due to poor prognosis.  CODE STATUS: Full code  Symptom Management/Plan: 1. ACP; Full code with full scope of treatment; Discussed at length with Sylvia, wishes to continue  2. 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 Visit consisted of counseling and education dealing with the complex and emotionally intense issues of symptom management and palliative care in the setting of serious and potentially life-threatening illness  Follow up Palliative Care Visit: Palliative care will continue to follow for complex medical decision making, advance care planning, and clarification of goals. Return 2 weeks or prn.  I spent 65 minutes providing this consultation. More than 50% of the time in this consultation was spent in counseling and care coordination.  PPS: 30%  HOSPICE ELIGIBILITY/DIAGNOSIS: TBD  Chief Complaint: palliative consult for follow-up for complex medical decision making  HISTORY OF PRESENT ILLNESS:  Evan Good is a 50 y.o. 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 Hospitalised 10/26/2020 to 10/30/2020 for Sepsis with hypertension elevated WBC source was unclear could be skin lesions chronic hidradenitis in both armpits And or chronic decubitis. History of seizures resumed Keppra. On insulin therapy for diabetes. Chronic sacral decubitis. Chronic tube feeding. Palliative console completed high risk for recurrent admissions giving chronic comorbidities with poor prognosis. Palliative did see during hospitalization for 10/28/2020 Per documentation he was at [facility] for about 10 months discharged to rehab facility in   the  middle of the hospitalization was readmitted, cardiac arrest and coma. MOST form completed with wishes for full code, aggressive interventions, full scope of treatment. Mr Gerke was discharged to [facility] at [facility]. Staff endorses Mr. Bun requires total care for mobility, transfers as he is hoyer to recliner chair, turning, positioning, bathing, dressing, incontinence bowel and bladder. Fed though tube feedings essential to sustain life. At present Mr. Egerton is lying in bed, appears severely debilitated, chronically ill, no distress. No visitors present. I visited and observed Mr. Gwin. Mr Arrowood made eye contact, smiling. Mr. Ecklund is non verbal, nods his head, difficulty with cognition. Mr. Shively was cooperative with assessment. Mr. Maura appears comfortable. Emotional support provided. Mr. Zarzycki receives tube feedings through g-tube essential to sustain life. Mrs. Charles, Sylvia was at bedside. We talked about purpose of PC visit. Mrs. Portlock in agreement. We talked about PMH, chronic disease progression, multiple hospitalizations, transition to LTAC, now STR at [facility]. We talked about Mrs. Cusic coming to the [place] from [place] to meet Mr. Ridling, got married and married now 3 years. Mrs. Phang endorses she was not aware of Mr. Brunsman health problems. Mrs. Meares endorses she will continue to stand by him, helping with decision making, HCPOA, ensuring that he will not suffer which is her goals of care. Mrs. Mignone endorses Mr. Wishon has been through so much already. We talked about progression, current state of health, realistic expectations, role PC in POC. Supportive role. Mrs. Nagele endorses she would like to continue at this time full code with full scope of treatments, she was not ready to consider DNR. Mrs. Vinje endorses it is difficult when Mr. Elahi is critically ill, she is told he will pass and then he pulls through that crisis.  Mrs. Saulters endorses when it is his time God will take him. We talked about transition to LTC, Mrs. Drudge wishes to have him closer to her as she lives about 45 minutes away. Mrs. Vanhise endorses [facility] SW gave her a list of LTC facilities to look at. We talked about Mrs. Halteman having to work which makes it not possible for her to care for Mr. Lenhard at home. We talked about currently will continue to monitor and follow at [facility] with PC and then can continue to follow when transfers to another facility. Mrs. Mehaffey talked about challenges she has been going through with her life, decision making, stress. We talked about self care, caregiver stress, fatigue. Therapeutic listening, emotional support provided. Discussed will f/u in 1 week for close monitoring, Mrs. Loma in agreement. I updated staff, no new changes at present time.   History obtained from review of EMR, discussion with primary team, and interview with wife, facility staff and  Mr. Pollina.  I reviewed available labs, medications, imaging, studies and related documents from the EMR.  Records reviewed and summarized above.   ROS Full 14 system review of systems performed and negative with exception of: as per HPI.  Physical Exam: Constitutional: NAD General: frail appearing, severely debilitated, chronically ill EYES: lids intact, no discharge  ENMT: oral mucous membranes moist CV: S1S2, RRR, no LE edema Pulmonary: LCTA, no increased work of breathing, decrease bs Abdomen: , normo-active BS + 4 quadrants, soft and non tender, g-tube GU: deferred MSK: muscle wasting Skin: warm and dry, no rashes or wounds on visible skin Neuro:  + generalized weakness,  + cognitive impairment Psych: Alert, smiles, non-verbal  Questions and concerns were addressed.   The patient/wife was encouraged to call with questions and/or concerns. My business card was provided. Provided general support and encouragement, no other unmet needs  identified  Thank you for the opportunity to participate in the care of Mr. Ditter.  The palliative care team will continue to follow. Please call our office at [phone] if we can be of additional assistance.   This chart was dictated using voice recognition software. Despite best efforts to proofread, errors can occur which can change the documentation meaning.  Christin Z Gusler, NP , MSN, [facility]    

## 2020-11-14 ENCOUNTER — Other Ambulatory Visit: Payer: Self-pay

## 2020-11-14 ENCOUNTER — Non-Acute Institutional Stay: Payer: Medicaid Other | Admitting: Nurse Practitioner

## 2020-11-14 ENCOUNTER — Encounter: Payer: Self-pay | Admitting: Nurse Practitioner

## 2020-11-14 VITALS — BP 122/78 | HR 69 | Temp 98.0°F | Resp 18 | Wt 161.3 lb

## 2020-11-14 DIAGNOSIS — Z515 Encounter for palliative care: Secondary | ICD-10-CM

## 2020-11-14 DIAGNOSIS — R5381 Other malaise: Secondary | ICD-10-CM

## 2020-11-14 NOTE — Progress Notes (Signed)
[employer] [facility] Palliative Care Consult Note Telephone: [phone]  Fax: [phone]    Date of encounter: 11/14/20 PATIENT NAME: Evan Good [address] [place] [address]   [phone] (home)  DOB: August 08, 1970 MRN: [id number] PRIMARY CARE PROVIDER:   Dr [facility], Vicie Mutters, MD,  [place] [address] [phone]   RESPONSIBLE PARTY:    Contact Information    Name Relation Home Work [place], [place]       I met face to face with patient and wife in facility. Palliative Care was asked to follow this patient by consultation request of  Dr Reesa Chew to address advance care planning and complex medical decision making. This is a follow up visit  ASSESSMENT AND PLAN / RECOMMENDATIONS:   Symptom Management/Plan: 1. ACP; Full code with full scope of treatment; Hope is to have Evan Good transferred to LTC facility closer to Evan Good.   2. 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 Visit consisted of counseling and education dealing with the complex and emotionally intense issues of symptom management and palliative care in the setting of serious and potentially life-threatening illness  Follow up Palliative Care Visit: Palliative care will continue to follow for complex medical decision making, advance care planning, and clarification of goals. Return 4 weeks or prn.  I spent 65 minutes providing this  consultation. More than 50% of the time in this consultation was spent in counseling and care coordination.  PPS: 30%  HOSPICE ELIGIBILITY/DIAGNOSIS: TBD  Chief Complaint: Follow up Palliative consult for complex medical decision making  HISTORY OF PRESENT ILLNESS:  Evan Good is a 50 y.o. 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 Hospitalised 10/26/2020 to 10/30/2020 for Sepsis with hypertension elevated WBC source was unclear could be skin lesions chronic hidradenitis in both armpits And or chronic decubitis. History of seizures resumed Keppra. On insulin therapy for diabetes. Chronic sacral decubitis. Chronic tube feeding. Palliative console completed high risk for recurrent admissions giving chronic comorbidities with poor prognosis. Palliative did see during hospitalization for 10/28/2020 Per documentation he was at [facility] for about 10 months discharged to rehab facility in the middle of the hospitalization was readmitted, cardiac arrest and coma. MOST form completed with wishes for full code, aggressive interventions, full scope of treatment.Evan Good was discharged to [place] at [facility]. Staff endorses Evan Good requires total care for mobility, transfers as he is hoyer to recliner chair, turning, positioning, bathing, dressing, incontinence bowel and bladder. Fed though tube feedings essential to sustain life. Staff endorses no new changes or concerns. I visited and observed Evan Good. Evan Good does make eye contact with verbal cues, smiling, blinking, nodding his head to answers. Evan Good is nodding to the music being  played in his room. Evan Good appears very content. Evan Good appears comfortable, no visitors  present. Evan Good was cooperative with assessment. Emotional support provided. No meaningful discussion due to cognitive impairment. I called Evan Good with clinical update. We talked about PC visit. Medical goals reviewed. We talked about transferring to facility closer to her home. We talked about expectations. We talked about PC in poc. Therapeutic listening and emotional support provided. I updated staff, no new changes to poc.   History obtained from review of EMR, discussion wife, facility staff and observed Evan Good.  I reviewed available labs, medications, imaging, studies and related documents from the EMR.  Records reviewed and summarized above.   ROS Full 14 system review of systems performed and negative with exception of: as per HPI.  Physical Exam: Constitutional: NAD General: frail appearing, thin, severely debilitated, chronically ill, Good EYES:  lids intact ENMT:  oral mucous membranes moist CV: S1S2, RRR, no LE edema Pulmonary: decrease breath sounds, no increased work of breathing, room air Abdomen: normo-active BS + 4 quadrants, soft and non tender, g-tube present GU: deferred MSK: muscle wasting Skin: warm and dry, no rashes or wounds on visible skin Neuro:  + generalized weakness,  + cognitive impairment Psych: cognitive impairment, non-verbal, nods his head, smiles  Questions and concerns were addressed. The patient/family was encouraged to call with questions and/or concerns. My business card was provided. Provided general support and encouragement, no other unmet needs identified  Thank you for the opportunity to participate in the care of Evan. Mcanany.  The palliative care team will continue to follow. Please call our office at [phone] if we can be of additional assistance.   This chart was dictated using voice recognition software. Despite best efforts  to proofread, errors can occur which can change the documentation meaning.   Sheretha Shadd Ihor Gully, NP , MSN,  [facility]

## 2020-12-16 ENCOUNTER — Observation Stay: Payer: Medicaid Other

## 2020-12-16 ENCOUNTER — Other Ambulatory Visit: Payer: Self-pay

## 2020-12-16 ENCOUNTER — Inpatient Hospital Stay
Admission: EM | Admit: 2020-12-16 | Discharge: 2020-12-18 | DRG: 602 | Disposition: A | Discharge status: Skilled Nursing Facility | Payer: Medicaid Other | Source: Skilled Nursing Facility | Attending: Family Medicine | Admitting: Family Medicine

## 2020-12-16 DIAGNOSIS — R627 Adult failure to thrive: Secondary | ICD-10-CM | POA: Diagnosis present

## 2020-12-16 DIAGNOSIS — K219 Gastro-esophageal reflux disease without esophagitis: Secondary | ICD-10-CM | POA: Diagnosis present

## 2020-12-16 DIAGNOSIS — Z79899 Other long term (current) drug therapy: Secondary | ICD-10-CM

## 2020-12-16 DIAGNOSIS — L88 Pyoderma gangrenosum: Secondary | ICD-10-CM | POA: Diagnosis present

## 2020-12-16 DIAGNOSIS — Z6820 Body mass index (BMI) 20.0-20.9, adult: Secondary | ICD-10-CM

## 2020-12-16 DIAGNOSIS — Z794 Long term (current) use of insulin: Secondary | ICD-10-CM

## 2020-12-16 DIAGNOSIS — R6889 Other general symptoms and signs: Secondary | ICD-10-CM | POA: Diagnosis present

## 2020-12-16 DIAGNOSIS — I10 Essential (primary) hypertension: Secondary | ICD-10-CM | POA: Diagnosis present

## 2020-12-16 DIAGNOSIS — Z7401 Bed confinement status: Secondary | ICD-10-CM

## 2020-12-16 DIAGNOSIS — L02411 Cutaneous abscess of right axilla: Secondary | ICD-10-CM | POA: Diagnosis not present

## 2020-12-16 DIAGNOSIS — L03111 Cellulitis of right axilla: Secondary | ICD-10-CM | POA: Diagnosis present

## 2020-12-16 DIAGNOSIS — E785 Hyperlipidemia, unspecified: Secondary | ICD-10-CM | POA: Diagnosis present

## 2020-12-16 DIAGNOSIS — L0390 Cellulitis, unspecified: Secondary | ICD-10-CM | POA: Diagnosis present

## 2020-12-16 DIAGNOSIS — J449 Chronic obstructive pulmonary disease, unspecified: Secondary | ICD-10-CM | POA: Diagnosis present

## 2020-12-16 DIAGNOSIS — E43 Unspecified severe protein-calorie malnutrition: Secondary | ICD-10-CM | POA: Diagnosis present

## 2020-12-16 DIAGNOSIS — Z9884 Bariatric surgery status: Secondary | ICD-10-CM

## 2020-12-16 DIAGNOSIS — L02412 Cutaneous abscess of left axilla: Secondary | ICD-10-CM | POA: Diagnosis not present

## 2020-12-16 DIAGNOSIS — G9389 Other specified disorders of brain: Secondary | ICD-10-CM | POA: Diagnosis present

## 2020-12-16 DIAGNOSIS — L03119 Cellulitis of unspecified part of limb: Secondary | ICD-10-CM

## 2020-12-16 DIAGNOSIS — L8990 Pressure ulcer of unspecified site, unspecified stage: Secondary | ICD-10-CM | POA: Diagnosis present

## 2020-12-16 DIAGNOSIS — E440 Moderate protein-calorie malnutrition: Secondary | ICD-10-CM | POA: Diagnosis present

## 2020-12-16 DIAGNOSIS — L89150 Pressure ulcer of sacral region, unstageable: Secondary | ICD-10-CM | POA: Diagnosis present

## 2020-12-16 DIAGNOSIS — D638 Anemia in other chronic diseases classified elsewhere: Secondary | ICD-10-CM | POA: Diagnosis present

## 2020-12-16 DIAGNOSIS — Z8669 Personal history of other diseases of the nervous system and sense organs: Secondary | ICD-10-CM

## 2020-12-16 DIAGNOSIS — L732 Hidradenitis suppurativa: Secondary | ICD-10-CM | POA: Diagnosis present

## 2020-12-16 DIAGNOSIS — Z87820 Personal history of traumatic brain injury: Secondary | ICD-10-CM

## 2020-12-16 DIAGNOSIS — L89154 Pressure ulcer of sacral region, stage 4: Secondary | ICD-10-CM | POA: Diagnosis present

## 2020-12-16 DIAGNOSIS — Z931 Gastrostomy status: Secondary | ICD-10-CM

## 2020-12-16 DIAGNOSIS — G40909 Epilepsy, unspecified, not intractable, without status epilepticus: Secondary | ICD-10-CM | POA: Diagnosis present

## 2020-12-16 DIAGNOSIS — D72829 Elevated white blood cell count, unspecified: Secondary | ICD-10-CM | POA: Diagnosis present

## 2020-12-16 DIAGNOSIS — L03112 Cellulitis of left axilla: Principal | ICD-10-CM | POA: Diagnosis present

## 2020-12-16 LAB — GLUCOSE, CAPILLARY
Glucose-Capillary: 43 mg/dL — CL (ref 70–99)
Glucose-Capillary: 61 mg/dL — ABNORMAL LOW (ref 70–99)
Glucose-Capillary: 67 mg/dL — ABNORMAL LOW (ref 70–99)
Glucose-Capillary: 147 mg/dL — ABNORMAL HIGH (ref 70–99)

## 2020-12-16 LAB — CBC WITH DIFFERENTIAL/PLATELET
Abs Immature Granulocytes: 0.03 10*3/uL (ref 0.00–0.07)
Basophils Absolute: 0.1 10*3/uL (ref 0.0–0.1)
Basophils Relative: 1 %
Eosinophils Absolute: 0.3 10*3/uL (ref 0.0–0.5)
Eosinophils Relative: 2 %
HCT: 28.9 % — ABNORMAL LOW (ref 39.0–52.0)
Hemoglobin: 8.6 g/dL — ABNORMAL LOW (ref 13.0–17.0)
Immature Granulocytes: 0 %
Lymphocytes Relative: 19 %
Lymphs Abs: 2.4 10*3/uL (ref 0.7–4.0)
MCH: 26.8 pg (ref 26.0–34.0)
MCHC: 29.8 g/dL — ABNORMAL LOW (ref 30.0–36.0)
MCV: 90 fL (ref 80.0–100.0)
Monocytes Absolute: 0.9 10*3/uL (ref 0.1–1.0)
Monocytes Relative: 7 %
Neutro Abs: 9 10*3/uL — ABNORMAL HIGH (ref 1.7–7.7)
Neutrophils Relative %: 71 %
Platelets: 424 10*3/uL — ABNORMAL HIGH (ref 150–400)
RBC: 3.21 MIL/uL — ABNORMAL LOW (ref 4.22–5.81)
RDW: 15.8 % — ABNORMAL HIGH (ref 11.5–15.5)
WBC: 12.6 10*3/uL — ABNORMAL HIGH (ref 4.0–10.5)
nRBC: 0 % (ref 0.0–0.2)

## 2020-12-16 LAB — C-REACTIVE PROTEIN: CRP: 10.7 mg/dL — ABNORMAL HIGH (ref <1.0)

## 2020-12-16 LAB — MAGNESIUM: Magnesium: 2.5 mg/dL — ABNORMAL HIGH (ref 1.7–2.4)

## 2020-12-16 LAB — SEDIMENTATION RATE: Sed Rate: 128 mm/hr — ABNORMAL HIGH (ref 0–15)

## 2020-12-16 LAB — MRSA PCR SCREENING: MRSA by PCR: POSITIVE — AB

## 2020-12-16 LAB — PHOSPHORUS: Phosphorus: 4.3 mg/dL (ref 2.5–4.6)

## 2020-12-16 MED ORDER — INSULIN ASPART 100 UNIT/ML IJ SOLN: 0.0000 [IU] | Freq: Every day | INTRAMUSCULAR | Status: DC

## 2020-12-16 MED ORDER — HEPARIN SODIUM (PORCINE) 5000 UNIT/ML IJ SOLN: 5000.0000 [IU] | Freq: Three times a day (TID) | INTRAMUSCULAR | Status: DC

## 2020-12-16 MED ORDER — LEVETIRACETAM 100 MG/ML PO SOLN
1000.0000 mg | Freq: Two times a day (BID) | ORAL | Status: DC
  Administered 2020-12-16 – 2020-12-18 (×4): 1000 mg via JEJUNOSTOMY
  Filled 2020-12-16 (×5): qty 10

## 2020-12-16 MED ORDER — INSULIN ASPART 100 UNIT/ML IJ SOLN
0.0000 [IU] | Freq: Three times a day (TID) | INTRAMUSCULAR | Status: DC
  Administered 2020-12-17 (×2): 7 [IU] via SUBCUTANEOUS
  Filled 2020-12-16 (×2): qty 1

## 2020-12-16 MED ORDER — SUPER THERA VITE M PO TABS: 1.0000 | ORAL_TABLET | Freq: Every day | ORAL | Status: DC

## 2020-12-16 MED ORDER — VANCOMYCIN HCL IN DEXTROSE 1-5 GM/200ML-% IV SOLN
1000.0000 mg | Freq: Once | INTRAVENOUS | Status: DC
  Filled 2020-12-16: qty 200

## 2020-12-16 MED ORDER — DEXTROSE 50 % IV SOLN
INTRAVENOUS | Status: AC
  Administered 2020-12-16 (×2): 12.5 mL
  Filled 2020-12-16: qty 50

## 2020-12-16 MED ORDER — VANCOMYCIN HCL IN DEXTROSE 1-5 GM/200ML-% IV SOLN
1000.0000 mg | Freq: Two times a day (BID) | INTRAVENOUS | Status: DC
  Administered 2020-12-16: 1000 mg via INTRAVENOUS

## 2020-12-16 MED ORDER — CHLORHEXIDINE GLUCONATE CLOTH 2 % EX PADS
6.0000 | MEDICATED_PAD | Freq: Every day | CUTANEOUS | Status: DC
  Administered 2020-12-17 – 2020-12-18 (×2): 6 via TOPICAL

## 2020-12-16 MED ORDER — SODIUM CHLORIDE 0.9 % IV SOLN
1.0000 g | Freq: Once | INTRAVENOUS | Status: AC
  Administered 2020-12-16: 1 g via INTRAVENOUS
  Filled 2020-12-16: qty 10

## 2020-12-16 MED ORDER — MUPIROCIN 2 % EX OINT
1.0000 "application " | TOPICAL_OINTMENT | Freq: Two times a day (BID) | CUTANEOUS | Status: DC
  Administered 2020-12-17 – 2020-12-18 (×3): 1 via NASAL
  Filled 2020-12-16: qty 22

## 2020-12-16 MED ORDER — HEPARIN SODIUM (PORCINE) 5000 UNIT/ML IJ SOLN
5000.0000 [IU] | Freq: Three times a day (TID) | INTRAMUSCULAR | Status: DC
  Administered 2020-12-16 – 2020-12-18 (×5): 5000 [IU] via SUBCUTANEOUS
  Filled 2020-12-16 (×5): qty 1

## 2020-12-16 MED ORDER — INSULIN GLARGINE 100 UNIT/ML ~~LOC~~ SOLN
25.0000 [IU] | Freq: Every day | SUBCUTANEOUS | Status: DC
  Administered 2020-12-17: 25 [IU] via SUBCUTANEOUS
  Filled 2020-12-16 (×2): qty 0.25

## 2020-12-16 MED ORDER — ONDANSETRON HCL 4 MG PO TABS: 4.0000 mg | ORAL_TABLET | Freq: Four times a day (QID) | ORAL | Status: DC | PRN

## 2020-12-16 MED ORDER — ALBUTEROL SULFATE (2.5 MG/3ML) 0.083% IN NEBU: 2.5000 mg | INHALATION_SOLUTION | Freq: Four times a day (QID) | RESPIRATORY_TRACT | Status: DC | PRN

## 2020-12-16 MED ORDER — ALBUTEROL SULFATE HFA 108 (90 BASE) MCG/ACT IN AERS: 2.0000 | INHALATION_SPRAY | Freq: Four times a day (QID) | RESPIRATORY_TRACT | Status: DC | PRN

## 2020-12-16 MED ORDER — RA FISH OIL 1000 MG PO CAPS: 1.0000 | ORAL_CAPSULE | Freq: Every day | ORAL | Status: DC

## 2020-12-16 MED ORDER — OSMOLITE 1.5 CAL PO LIQD
1000.0000 mL | ORAL | Status: DC
  Administered 2020-12-16: 1000 mL

## 2020-12-16 MED ORDER — ONDANSETRON HCL 4 MG/2ML IJ SOLN: 4.0000 mg | Freq: Four times a day (QID) | INTRAMUSCULAR | Status: DC | PRN

## 2020-12-16 MED ORDER — ACETAMINOPHEN 650 MG RE SUPP: 650.0000 mg | Freq: Four times a day (QID) | RECTAL | Status: DC | PRN

## 2020-12-16 MED ORDER — THERA-M PO TABS: 1.0000 | ORAL_TABLET | Freq: Every day | ORAL | Status: DC

## 2020-12-16 MED ORDER — ADULT MULTIVITAMIN W/MINERALS CH
1.0000 | ORAL_TABLET | Freq: Every day | ORAL | Status: DC
  Administered 2020-12-17 – 2020-12-18 (×2): 1
  Filled 2020-12-16 (×2): qty 1

## 2020-12-16 MED ORDER — FAMOTIDINE 20 MG PO TABS
20.0000 mg | ORAL_TABLET | Freq: Every day | ORAL | Status: DC
  Administered 2020-12-17 – 2020-12-18 (×2): 20 mg
  Filled 2020-12-16 (×2): qty 1

## 2020-12-16 MED ORDER — PROSOURCE TF PO LIQD
45.0000 mL | Freq: Two times a day (BID) | ORAL | Status: DC
  Administered 2020-12-16 – 2020-12-17 (×2): 45 mL
  Filled 2020-12-16 (×3): qty 45

## 2020-12-16 MED ORDER — MORPHINE SULFATE (PF) 4 MG/ML IV SOLN
4.0000 mg | Freq: Once | INTRAVENOUS | Status: AC
  Administered 2020-12-16: 4 mg via INTRAVENOUS
  Filled 2020-12-16: qty 1

## 2020-12-16 MED ORDER — VANCOMYCIN HCL IN DEXTROSE 1-5 GM/200ML-% IV SOLN
1000.0000 mg | Freq: Two times a day (BID) | INTRAVENOUS | Status: DC
  Filled 2020-12-16 (×2): qty 200

## 2020-12-16 MED ORDER — CHLORHEXIDINE GLUCONATE 0.12 % MT SOLN
5.0000 mL | Freq: Two times a day (BID) | OROMUCOSAL | Status: DC
  Administered 2020-12-16 – 2020-12-18 (×4): 5 mL via OROMUCOSAL
  Filled 2020-12-16 (×4): qty 15

## 2020-12-16 MED ORDER — VITAMIN E 180 MG (400 UNIT) PO CAPS: 2000.0000 [IU] | ORAL_CAPSULE | Freq: Every day | ORAL | Status: DC

## 2020-12-16 MED ORDER — HYDROCODONE-ACETAMINOPHEN 5-325 MG PO TABS
1.0000 | ORAL_TABLET | Freq: Two times a day (BID) | ORAL | Status: DC | PRN
  Administered 2020-12-17 – 2020-12-18 (×2): 1 via ORAL
  Filled 2020-12-16 (×2): qty 1

## 2020-12-16 MED ORDER — PRAVASTATIN SODIUM 40 MG PO TABS
40.0000 mg | ORAL_TABLET | Freq: Every day | ORAL | Status: DC
  Filled 2020-12-16: qty 1

## 2020-12-16 MED ORDER — LACTATED RINGERS IV BOLUS
1000.0000 mL | Freq: Once | INTRAVENOUS | Status: AC
  Administered 2020-12-16: 1000 mL via INTRAVENOUS

## 2020-12-16 MED ORDER — CARBAMAZEPINE 200 MG PO TABS: 200.0000 mg | ORAL_TABLET | ORAL | Status: DC

## 2020-12-16 MED ORDER — ACETAMINOPHEN 325 MG PO TABS: 650.0000 mg | ORAL_TABLET | Freq: Four times a day (QID) | ORAL | Status: DC | PRN

## 2020-12-16 MED ORDER — VANCOMYCIN HCL 1000 MG/200ML IV SOLN
1000.0000 mg | Freq: Two times a day (BID) | INTRAVENOUS | Status: AC
Start: 2020-12-16 — End: 2020-12-16
  Administered 2020-12-16: 1000 mg via INTRAVENOUS
  Filled 2020-12-16 (×2): qty 200

## 2020-12-16 MED ORDER — LORAZEPAM 2 MG/ML IJ SOLN: 2.0000 mg | INTRAMUSCULAR | Status: DC | PRN

## 2020-12-16 MED ORDER — INSULIN GLARGINE 100 UNIT/ML ~~LOC~~ SOLN
25.0000 [IU] | Freq: Every day | SUBCUTANEOUS | Status: DC
  Filled 2020-12-16: qty 0.25

## 2020-12-16 MED ORDER — CARBAMAZEPINE 200 MG PO TABS: 200.0000 mg | ORAL_TABLET | Freq: Three times a day (TID) | ORAL | Status: DC

## 2020-12-16 MED ORDER — VITAMIN B-12 1000 MCG PO TABS
1000.0000 ug | ORAL_TABLET | Freq: Every day | ORAL | Status: DC
  Administered 2020-12-17 – 2020-12-18 (×2): 1000 ug
  Filled 2020-12-16 (×2): qty 1

## 2020-12-16 MED ORDER — THIAMINE HCL 100 MG PO TABS
100.0000 mg | ORAL_TABLET | Freq: Every day | ORAL | Status: DC
  Administered 2020-12-17 – 2020-12-18 (×2): 100 mg
  Filled 2020-12-16 (×2): qty 1

## 2020-12-16 MED ORDER — MELATONIN 5 MG PO TABS
5.0000 mg | ORAL_TABLET | Freq: Every day | ORAL | Status: DC
  Administered 2020-12-16 – 2020-12-17 (×2): 5 mg
  Filled 2020-12-16 (×2): qty 1

## 2020-12-16 MED ORDER — VITAMIN D 25 MCG (1000 UNIT) PO TABS
1000.0000 [IU] | ORAL_TABLET | Freq: Every day | ORAL | Status: DC
  Administered 2020-12-17 – 2020-12-18 (×2): 1000 [IU] via JEJUNOSTOMY
  Filled 2020-12-16 (×2): qty 1

## 2020-12-16 MED ORDER — ZINC SULFATE 220 (50 ZN) MG PO CAPS
220.0000 mg | ORAL_CAPSULE | Freq: Every day | ORAL | Status: DC
  Administered 2020-12-17 – 2020-12-18 (×2): 220 mg via ORAL
  Filled 2020-12-16 (×2): qty 1

## 2020-12-16 MED ORDER — VANCOMYCIN HCL IN DEXTROSE 1-5 GM/200ML-% IV SOLN
1000.0000 mg | Freq: Two times a day (BID) | INTRAVENOUS | Status: DC
  Administered 2020-12-17 – 2020-12-18 (×3): 1000 mg via INTRAVENOUS
  Filled 2020-12-16 (×5): qty 200

## 2020-12-16 MED ORDER — FOLIC ACID 1 MG PO TABS
1.0000 mg | ORAL_TABLET | Freq: Every day | ORAL | Status: DC
  Administered 2020-12-17 – 2020-12-18 (×2): 1 mg
  Filled 2020-12-16 (×2): qty 1

## 2020-12-16 NOTE — ED Notes (Signed)
EDP [place] and attending [facility] agree head CT no longer needed so will not reorder since it didn't transfer from previous chart.

## 2020-12-16 NOTE — Consult Note (Addendum)
Pharmacy Antibiotic Note  Evan Good is a 50 y.o. male admitted on 12/16/2020 with cellulitis.  Pharmacy has been consulted for vancomycin dosing.  Plan: Will order vancomycin 1000 mg q12H. Pt received vancomycin 1000 mg x 1 in the ED. Predicted AUC  491. Goal AUC 400-550. Obtain vancomycin level prior to the 4th or 5th dose.   Height: 5\' 11"  (180.3 cm) Weight: 65.8 kg (145 lb) IBW/kg (Calculated) : 75.3  Temp (24hrs), Avg:98.4 F (36.9 C), Min:98.4 F (36.9 C), Max:98.4 F (36.9 C)  Recent Labs  Lab 12/16/20 1210  WBC 12.6*  CREATININE 0.82  LATICACIDVEN 1.5    Estimated Creatinine Clearance: 85.8 mL/min (by C-G formula based on SCr of 0.82 mg/dL).    No Known Allergies  Antimicrobials this admission: 6/7 vancomycin >>   Dose adjustments this admission: None  Microbiology results: 6/7 BCx: pending  Thank you for allowing pharmacy to be a part of this patient's care.  8/7, PharmD, BCPS 12/16/2020 2:08 PM

## 2020-12-16 NOTE — ED Notes (Signed)
400cc left of LR bolus. Paused while rocephin running.

## 2020-12-16 NOTE — ED Notes (Signed)
Attempted for 22g at lower R fa.

## 2020-12-16 NOTE — ED Notes (Addendum)
Pt's briefs dry; pt has clean open grade 3 ulcer and grade 2 ulcers to sacrum; open sores at armpits; L armpit oozing white and malodorous; R armpit pink/purple in color; oozing pink and white from sore with hard cyst-like deformity at anterior portion of arm. Both feet remain in safety/pressure-relieving boots sent with pt from facility. Feeding tube at abdomen clean.

## 2020-12-16 NOTE — ED Notes (Signed)
Attempted for 22g at L fa.  

## 2020-12-16 NOTE — ED Triage Notes (Signed)
Pt to ED from [facility] by EMS for evaluation of pressure ulcers and abscess to buttocks and armpits. Patient is mostly nonverbal due to Hx anoxic brain injury.

## 2020-12-16 NOTE — ED Notes (Signed)
Phlebotomy staff at bedside.

## 2020-12-16 NOTE — ED Notes (Signed)
Misty [employer] operational officer currently setting up transport for pt to go to floor since room officially clean/ready.

## 2020-12-16 NOTE — H&P (Signed)
History and Physical   Evan Good HBZ:[id number] DOB: August 31, 1970 DOA: 12/16/2020  PCP: Patrecia Pour, MD  Patient coming from: [facility] facility  I have personally briefly reviewed patient's old medical records in [facility] EMR.  Chief Concern: Bilateral axillary purulence  HPI: Evan Good is a 50 y.o. male with medical history significant for History of C. 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  Family was not at bedside.  At bedside, patient opens eyes spontaneously, mumbles incoherently occasionally, patient does track eyes bilaterally.  Patient does respond to pain.  Patient was not able to answer questions and or follow specific commands such as squeezing of the arm, squeezing his eyes, opening his mouth.  Social history: Unknown  Vaccination history: Unknown  ROS: Unable to complete as patient is nonverbal at baseline  ED Course: Discussed with EDP, patient requiring hospitalization for bilateral axillary cellulitis.  Vitals in the emergency department was remarkable for temperature of 98.4, heart rate 101, blood pressure 109/65,Respiration rate of 20, blood pressure of 110/71.  Patient is status post morphine 4 mg IV once per EDP, ceftriaxone 1 g IV, vancomycin IV,  lactated Ringer 1 L bolus per ED provider.  Assessment/Plan  Principal Problem:   Cutaneous abscesses of both axillae Active Problems:   Leukocytosis   Total self-care deficit   Pressure injury of skin   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)   Malnutrition of moderate degree   Cellulitis   History of anoxic brain injury   # Cutaneous abscess of the bilateral axilla secondary to chronic contractures and history of hidradenitis suppurativa # Cellulitis # Bilateral axillary wounds-present on admission - Status post ceftriaxone IV and vancomycin IV per ED provider - Continue vancomycin IV per pharmacy - MRSA PCR screening ordered - Portable chest x-ray ordered to assess for gas and need for further imaging - Sed rate, CRP ordered - Media imaging taken - Wound care consult  # Leukocytosis suspect secondary to cellulitis-treat as above - CBC in the a.m.  # History of seizures-resumed home Keppra 1000 mg twice daily per tube with first dose at 2200 -Seizure precautions - Ativan 2 mg IV as needed every 4 hours for seizures, 3 doses ordered  # Sacral decubitus ulcer-present on admission, wound care consulted  # Severe protein/calorie malnutrition-present on admission # Total self-care deficit- in nursing home chronically # Failure to thrive - Patient receives nutrition via PEG tube - Resume tube feed, Osmolite, B12, vitamin B-1, folic acid, multivitamin, Prosource, vitamin D - Dietitian has been consulted  # History of anoxic brain injury secondary to presumed hypoglycemia and nutritional deficiency in setting of Roux-en-Y gastric bypass procedure  # Insulin-dependent diabetes mellitus - Insulin SSI with at bedtime coverage ordered - Resumed home glargine 25 units daily nightly starting 12/17/2020 - A1c on 10/28/2020 was 7.3  # Bilateral indications of pain with yelling and screaming with movement of arm-resumed home hydrocodone 5 mg every 12 hours as needed for  moderate and  severe pain  # GERD- famotidine 20 mg daily via tube  # Poor prognosis overall, patient has increased risks of recurrent admission due to comorbidities and failure to thrive  Chart reviewed.   Hospitalization from 10/27/2019-10/30/2020: Patient presented to the emergency department for chief concerns of feeding tube malfunctioning.  Patient was treated for meeting sepsis criteria with hypotension and elevated WBC.  Source is unclear however thought due to chronic hidradenitis and chronic decubitus.  Patient was treated initially with broad-spectrum antibiotic, cefepime, metronidazole, vancomycin per ED provider and changed to cefdinir until 11/09/2020 per recommendation from dermatology at [facility].  General surgery was consulted and does not recommend debridement.  DVT prophylaxis: Heparin 5000 units subcutaneous every 8 hours Code Status: Full code Diet: N.p.o., tube feeds Family Communication: No Disposition Plan: Pending clinical course, poor prognosis, high risk of multiple admission given chronic comorbidities Consults called: None at this time Admission status: MedSurg, observation, no telemetry ordered  Past Medical History:  Diagnosis Date  . COPD (chronic obstructive pulmonary disease) (HCC)   . Diabetes mellitus without complication (HCC)   . Seizures (HCC)    No past surgical history on file.  Social History:  reports that he has never smoked. He has never used smokeless tobacco. He reports previous alcohol use. He reports previous drug use.  No Known Allergies No family history on file. Family history: Family history reviewed and not pertinent  Prior to Admission medications   Medication Sig Start Date End Date Taking? Authorizing Provider  acetaminophen (TYLENOL) 325 MG tablet Take 2 tablets (650 mg total) by mouth every 6 (six) hours as needed for mild pain or moderate pain. 11/06/20   Alford Highland, MD  albuterol (VENTOLIN HFA) 108 (90 Base) MCG/ACT inhaler  Inhale 2 puffs into the lungs every 6 (six) hours as needed for wheezing or shortness of breath. 11/06/20   Alford Highland, MD  carbamazepine (TEGRETOL) 200 MG tablet Take 200 mg by mouth in the morning, at noon, and at bedtime. 08/07/20   [provider]  Cholecalciferol 25 MCG (1000 UT) tablet Take 1,000 Units by mouth in the morning and at bedtime. 05/29/17   [provider]  cyclobenzaprine (FLEXERIL) 10 MG tablet Take 1 tablet by mouth at bedtime. 08/22/20   [provider]  furosemide (LASIX) 20 MG tablet Take 1 tablet (20 mg total) by mouth daily as needed. 11/06/20   Alford Highland, MD  lisinopril (ZESTRIL) 20 MG tablet Take 1 tablet (20 mg total) by mouth daily. 11/06/20   Alford Highland, MD  Multiple Vitamins-Minerals (THERA-M) TABS Take 1 tablet by mouth daily. 08/24/18   [provider]  Omega-3 Fatty Acids (RA FISH OIL) 1000 MG CAPS Take 1 capsule by mouth daily.    [provider]  polyethylene glycol (MIRALAX / GLYCOLAX) 17 g packet Take 17 g by mouth daily as needed. 11/06/20   Alford Highland, MD  pravastatin (PRAVACHOL) 40 MG tablet Take 40 mg by mouth daily. 08/21/20   [provider]  vitamin E 1000 UNIT capsule Take 2,000 Units by mouth daily.    [provider]   Physical Exam: Vitals:   12/16/20 1545 12/16/20 1600 12/16/20 1615 12/16/20 1650  BP:  106/74  110/71  Pulse: 98  97 (!) 107  Resp:    18  Temp:    98.9 F (37.2 C)  TempSrc:      SpO2:  99%  98%  Weight:      Height:  Constitutional: appears older than chronological age, frail, cachectic,, NAD, calm, comfortable Eyes: PERRL, lids and conjunctivae normal ENMT: Mucous membranes are moist. Posterior pharynx clear of any exudate or lesions. Age-appropriate dentition. Hearing appropriate Neck: normal, supple, no masses, no thyromegaly Respiratory: clear to auscultation bilaterally, no wheezing, no crackles. Normal respiratory effort. No  accessory muscle use.  Cardiovascular: Regular rate and rhythm, no murmurs / rubs / gallops. No extremity edema. 2+ pedal pulses. No carotid bruits.  Abdomen: no tenderness, no masses palpated, no hepatosplenomegaly. Bowel sounds positive.  PEG tube in place at the left mid abdomen Musculoskeletal: no clubbing / cyanosis. No joint deformity upper and lower extremities.  Bilateral upper extremity contractures.  Bilateral upper and lower extremity atrophy.  Generalized decreased muscle tones. Skin: bilateral axillary purulence. Sacral decubitus ulcer.      Neurologic: Sensation intact. Strength 5/5 in all 4.  Psychiatric: Patient does not have judgment or insight as he has hypoxic brain injury.  Patient is not able to verbalize his name, age, current location.  Unable to assess mood  EKG: Not indicated  Chest x-ray on Admission: I personally reviewed and I agree with radiologist reading as below.  DG Chest Portable 1 View  Result Date: 12/16/2020 CLINICAL DATA:  Axillary infections EXAM: PORTABLE CHEST 1 VIEW COMPARISON:  October 26, 2020 FINDINGS: The lungs are clear. The heart is upper normal in size with pulmonary vascularity normal. No adenopathy. There is arthropathy in each shoulder. No abnormal air in either axillary region. IMPRESSION: No edema or airspace opacity.  Heart upper normal in size. Electronically Signed   By: Bretta Bang III M.D.   On: 12/16/2020 15:26   Labs on Admission: I have personally reviewed following labs  CBC: Recent Labs  Lab 12/16/20 1210  WBC 12.6*  NEUTROABS 9.0*  HGB 8.6*  HCT 28.9*  MCV 90.0  PLT 424*   Basic Metabolic Panel: Recent Labs  Lab 12/16/20 1210  NA 142  K 5.0  CL 109  CO2 27  GLUCOSE 117*  BUN 50*  CREATININE 0.82  CALCIUM 9.2   GFR: Estimated Creatinine Clearance: 101.4 mL/min (by C-G formula based on SCr of 0.82 mg/dL).  Cardiac Enzymes: Recent Labs  Lab 12/16/20 1210  CKTOTAL 195   Urine analysis:     Component Value Date/Time   COLORURINE YELLOW (A) 10/26/2020 1220   APPEARANCEUR CLOUDY (A) 10/26/2020 1220   LABSPEC 1.015 10/26/2020 1220   PHURINE 8.0 10/26/2020 1220   GLUCOSEU NEGATIVE 10/26/2020 1220   HGBUR MODERATE (A) 10/26/2020 1220   BILIRUBINUR NEGATIVE 10/26/2020 1220   KETONESUR NEGATIVE 10/26/2020 1220   PROTEINUR 30 (A) 10/26/2020 1220   NITRITE NEGATIVE 10/26/2020 1220   LEUKOCYTESUR LARGE (A) 10/26/2020 1220   Dr. Sedalia Muta [facility]  If 7PM-7AM, please contact overnight-coverage provider If 7AM-7PM, please contact day coverage provider [email or web address]  12/16/2020, 5:13 PM

## 2020-12-16 NOTE — ED Notes (Signed)
Called lab about add-ons. Will send new blood if necessary.

## 2020-12-16 NOTE — ED Provider Notes (Addendum)
[facility] Emergency Department Provider Note   ____________________________________________   Event Date/Time   First MD Initiated Contact with Patient 12/16/20 1200     (approximate)  I have reviewed the triage vital signs and the nursing notes.   HISTORY  Chief Complaint Abscess (Pressure ulcers to buttocks and armpits)    HPI Evan Good is a 50 y.o. male with possible history of hypertension, hyperlipidemia, remote TBI with right-sided weakness and contractures, and seizures who presents to the ED for skin ulceration.  History is limited due to patient's prior TBI as he primarily communicates by nodding his head.  EMS reports that there is concern for developing ulceration in the area of patient's bilateral axillae.  It is unclear how long these areas of ulceration have been present, patient also has known pressure ulceration to the area of his sacrum.  Patient acknowledges these areas are painful and both areas appear to be draining purulent material.  EMS denies any recent fevers, nausea, or vomiting.  Patient currently residing at [facility] following admission for rhabdomyolysis and recent right femoral fracture that has been managed nonoperatively.        Past Medical History:  Diagnosis Date  . Anemia   . Anoxic brain injury (HCC)   . Constipation   . COPD (chronic obstructive pulmonary disease) (HCC)   . Diabetes mellitus without complication (HCC)   . Failure to thrive (child)   . GERD (gastroesophageal reflux disease)   . Hidradenitis   . Hyperlipidemia   . Hypertension   . Seizures [facility])     Patient Active Problem List   Diagnosis Date Noted  . Fall at home, initial encounter 12/04/2020  . Elevated CK 12/04/2020  . Hyperlipidemia   . Hyponatremia   . Seizure disorder (HCC)   . Essential hypertension   . Constipation   . Femur fracture, right (HCC) 11/03/2020    Past Surgical History:  Procedure  Laterality Date  . GASTROSTOMY TUBE PLACEMENT      Prior to Admission medications   Medication Sig Start Date End Date Taking? Authorizing Provider  acetaminophen (TYLENOL) 325 MG tablet Take 2 tablets (650 mg total) by mouth every 6 (six) hours as needed for mild pain or moderate pain. 11/06/20   Alford Highland, MD  albuterol (VENTOLIN HFA) 108 (90 Base) MCG/ACT inhaler Inhale 2 puffs into the lungs every 6 (six) hours as needed for wheezing or shortness of breath. 11/06/20   Alford Highland, MD  carbamazepine (TEGRETOL) 200 MG tablet Take 200 mg by mouth in the morning, at noon, and at bedtime. 08/07/20   [provider]  Cholecalciferol 25 MCG (1000 UT) tablet Take 1,000 Units by mouth in the morning and at bedtime. 05/29/17   [provider]  cyclobenzaprine (FLEXERIL) 10 MG tablet Take 1 tablet by mouth at bedtime. 08/22/20   [provider]  furosemide (LASIX) 20 MG tablet Take 1 tablet (20 mg total) by mouth daily as needed. 11/06/20   Alford Highland, MD  lisinopril (ZESTRIL) 20 MG tablet Take 1 tablet (20 mg total) by mouth daily. 11/06/20   Alford Highland, MD  Multiple Vitamins-Minerals (THERA-M) TABS Take 1 tablet by mouth daily. 08/24/18   [provider]  Omega-3 Fatty Acids (RA FISH OIL) 1000 MG CAPS Take 1 capsule by mouth daily.    [provider]  polyethylene glycol (MIRALAX / GLYCOLAX) 17 g packet Take 17 g by mouth daily as needed. 11/06/20   Wieting,  Richard, MD  pravastatin (PRAVACHOL) 40 MG tablet Take 40 mg by mouth daily. 08/21/20   [provider]  vitamin E 1000 UNIT capsule Take 2,000 Units by mouth daily.    [provider]    Allergies Patient has no known allergies.  No family history on file.  Social History Social History   Tobacco Use  . Smoking status: Heavy Tobacco Smoker    Packs/day: 1.00    Years: 42.00    Pack years: 42.00    Types: Cigarettes  . Smokeless tobacco: Never Used   Substance Use Topics  . Alcohol use: Not Currently  . Drug use: Never    Review of Systems  Constitutional: No fever/chills Eyes: No visual changes. ENT: No sore throat. Cardiovascular: Denies chest pain. Respiratory: Denies shortness of breath. Gastrointestinal: No abdominal pain.  No nausea, no vomiting.  No diarrhea.  No constipation. Genitourinary: Negative for dysuria. Musculoskeletal: Negative for back pain. Skin: Positive for rash. Neurological: Negative for headaches, focal weakness or numbness.  ____________________________________________   PHYSICAL EXAM:  VITAL SIGNS: ED Triage Vitals  Enc Vitals Group     BP      Pulse      Resp      Temp      Temp src      SpO2      Weight      Height      Head Circumference      Peak Flow      Pain Score      Pain Loc      Pain Edu?      Excl. in GC?     Constitutional: Awake and alert, communicates by nodding head. Eyes: Conjunctivae are normal. Head: Atraumatic. Nose: No congestion/rhinnorhea. Mouth/Throat: Mucous membranes are dry. Neck: Normal ROM Cardiovascular: Tachycardic, regular rhythm. Grossly normal heart sounds. Respiratory: Normal respiratory effort.  No retractions. Lungs CTAB. Gastrointestinal: Soft and nontender. No distention. Genitourinary: deferred Musculoskeletal: No lower extremity tenderness nor edema. Neurologic: Patient nonverbal.  Bilateral weakness and contractures noted. Skin: Healing pressure injury to sacrum with no erythema, warmth, or drainage.  Pressure injuries noted to bilateral axillary with significant skin breakdown and purulent drainage, no focal fluctuance noted. Psychiatric: Unable to assess.  ____________________________________________   LABS (all labs ordered are listed, but only abnormal results are displayed)  Labs Reviewed  CBC WITH DIFFERENTIAL/PLATELET - Abnormal; Notable for the following components:      Result Value   WBC 12.6 (*)    RBC 3.21 (*)     Hemoglobin 8.6 (*)    HCT 28.9 (*)    MCHC 29.8 (*)    RDW 15.8 (*)    Platelets 424 (*)    Neutro Abs 9.0 (*)    All other components within normal limits  BASIC METABOLIC PANEL - Abnormal; Notable for the following components:   Glucose, Bld 117 (*)    BUN 50 (*)    All other components within normal limits  CULTURE, BLOOD (ROUTINE X 2)  CULTURE, BLOOD (ROUTINE X 2)  SARS CORONAVIRUS 2 (TAT 6-24 HRS)  LACTIC ACID, PLASMA  CK  LACTIC ACID, PLASMA     PROCEDURES  Procedure(s) performed (including Critical Care):  Procedures   ____________________________________________   INITIAL IMPRESSION / ASSESSMENT AND PLAN / ED COURSE       50 year old male with past medical history of remote TBI and resulting weakness with contractures, hypertension, hyperlipidemia, and seizures who presents to the ED due to  concern for ulceration and drainage from his bilateral axillae.  Patient has significant contractures to his bilateral upper extremities and is unable to extend at either shoulder, which appears to have resulted and pressure injury to bilateral axillae.  There is erythema and purulent drainage concerning for infection, but no focal fluctuance to suggest abscess.  Patient is slightly tachycardic but remaining vital signs do not appear consistent with sepsis.  We will hydrate with IV fluids and give dose of IV antibiotics, anticipate admission for further treatment of cellulitis along with intensive wound care.  Labs remarkable for hemoglobin lower than previous although no active bleeding noted at this time, we will trend hemoglobin.  Patient given initial dose of Rocephin and vancomycin, case discussed with hospitalist for admission.  ----------------------------------------- 2:18 PM on 12/16/2020 ----------------------------------------- I was notified by registration that patient registered under the incorrect name as incorrect paperwork was provided from his nursing facility.   Registration will now work to merge chart and to patient's correct name.  ----------------------------------------- 3:00 PM on 12/16/2020 -----------------------------------------  Chart now merged to correct patient, who has a history of anoxic brain injury and current presentation does appear consistent with reported baseline mental status.  Patient does have a history of hidradenitis, consistent with his presentation today.  Hospitalist notified of merged chart and we will continue antibiotic treatment of what appears to be hidradenitis in his bilateral axillae.       ____________________________________________   FINAL CLINICAL IMPRESSION(S) / ED DIAGNOSES  Final diagnoses:  Cellulitis of axillary region  History of traumatic brain injury     ED Discharge Orders     None        Note:  This document was prepared using Dragon voice recognition software and may include unintentional dictation errors.   Chesley Noon, MD 12/16/20 1357    Chesley Noon, MD 12/16/20 1436    Chesley Noon, MD 12/16/20 1501    Chesley Noon, MD 12/17/20 [phone]

## 2020-12-16 NOTE — ED Notes (Signed)
Called lab to get phlebotomy to assist with 2nd set of cultures as pt hard stick. Joni Reining states will send someone as soon as possible.

## 2020-12-16 NOTE — Progress Notes (Signed)
Osmolite via tube feeding ordered with no rate stated. Also there is some purulent drainage noted around G tube site concerning for infection. MD notified.

## 2020-12-16 NOTE — ED Notes (Signed)
Per registration, paperwork given by facility for this pt is another patient's from facility. Will continue to chart under this name so that there is no delay in treatment until registration able to merge or otherwise fix issue.

## 2020-12-16 NOTE — ED Notes (Signed)
Double checking with attending whether to give vanc or not as appears d/c in chart/MAR.

## 2020-12-16 NOTE — Plan of Care (Signed)

## 2020-12-17 ENCOUNTER — Other Ambulatory Visit: Payer: Self-pay

## 2020-12-17 ENCOUNTER — Encounter: Payer: Self-pay | Admitting: Internal Medicine

## 2020-12-17 DIAGNOSIS — R627 Adult failure to thrive: Secondary | ICD-10-CM | POA: Diagnosis present

## 2020-12-17 DIAGNOSIS — L02411 Cutaneous abscess of right axilla: Secondary | ICD-10-CM | POA: Diagnosis not present

## 2020-12-17 DIAGNOSIS — G40909 Epilepsy, unspecified, not intractable, without status epilepticus: Secondary | ICD-10-CM | POA: Diagnosis present

## 2020-12-17 DIAGNOSIS — J449 Chronic obstructive pulmonary disease, unspecified: Secondary | ICD-10-CM | POA: Diagnosis present

## 2020-12-17 DIAGNOSIS — Z79899 Other long term (current) drug therapy: Secondary | ICD-10-CM | POA: Diagnosis not present

## 2020-12-17 DIAGNOSIS — D638 Anemia in other chronic diseases classified elsewhere: Secondary | ICD-10-CM | POA: Diagnosis present

## 2020-12-17 DIAGNOSIS — L03111 Cellulitis of right axilla: Secondary | ICD-10-CM | POA: Diagnosis present

## 2020-12-17 DIAGNOSIS — Z794 Long term (current) use of insulin: Secondary | ICD-10-CM | POA: Diagnosis not present

## 2020-12-17 DIAGNOSIS — E785 Hyperlipidemia, unspecified: Secondary | ICD-10-CM | POA: Diagnosis present

## 2020-12-17 DIAGNOSIS — L89154 Pressure ulcer of sacral region, stage 4: Secondary | ICD-10-CM | POA: Diagnosis present

## 2020-12-17 DIAGNOSIS — L02412 Cutaneous abscess of left axilla: Secondary | ICD-10-CM | POA: Diagnosis not present

## 2020-12-17 DIAGNOSIS — Z931 Gastrostomy status: Secondary | ICD-10-CM | POA: Diagnosis not present

## 2020-12-17 DIAGNOSIS — L88 Pyoderma gangrenosum: Secondary | ICD-10-CM | POA: Diagnosis present

## 2020-12-17 DIAGNOSIS — L0390 Cellulitis, unspecified: Secondary | ICD-10-CM | POA: Diagnosis not present

## 2020-12-17 DIAGNOSIS — Z9884 Bariatric surgery status: Secondary | ICD-10-CM | POA: Diagnosis not present

## 2020-12-17 DIAGNOSIS — K219 Gastro-esophageal reflux disease without esophagitis: Secondary | ICD-10-CM | POA: Diagnosis present

## 2020-12-17 DIAGNOSIS — G9389 Other specified disorders of brain: Secondary | ICD-10-CM | POA: Diagnosis present

## 2020-12-17 DIAGNOSIS — L03112 Cellulitis of left axilla: Secondary | ICD-10-CM | POA: Diagnosis present

## 2020-12-17 DIAGNOSIS — L732 Hidradenitis suppurativa: Secondary | ICD-10-CM | POA: Diagnosis present

## 2020-12-17 DIAGNOSIS — I10 Essential (primary) hypertension: Secondary | ICD-10-CM | POA: Diagnosis present

## 2020-12-17 DIAGNOSIS — E43 Unspecified severe protein-calorie malnutrition: Secondary | ICD-10-CM | POA: Diagnosis present

## 2020-12-17 DIAGNOSIS — Z7401 Bed confinement status: Secondary | ICD-10-CM | POA: Diagnosis not present

## 2020-12-17 DIAGNOSIS — Z6820 Body mass index (BMI) 20.0-20.9, adult: Secondary | ICD-10-CM | POA: Diagnosis not present

## 2020-12-17 LAB — CBC
HCT: 29.2 % — ABNORMAL LOW (ref 39.0–52.0)
Hemoglobin: 8.8 g/dL — ABNORMAL LOW (ref 13.0–17.0)
MCH: 27.2 pg (ref 26.0–34.0)
MCHC: 30.1 g/dL (ref 30.0–36.0)
MCV: 90.1 fL (ref 80.0–100.0)
Platelets: 345 10*3/uL (ref 150–400)
RBC: 3.24 MIL/uL — ABNORMAL LOW (ref 4.22–5.81)
RDW: 15.7 % — ABNORMAL HIGH (ref 11.5–15.5)
WBC: 9.4 10*3/uL (ref 4.0–10.5)
nRBC: 0 % (ref 0.0–0.2)

## 2020-12-17 LAB — BASIC METABOLIC PANEL
Anion gap: 7 (ref 5–15)
BUN: 48 mg/dL — ABNORMAL HIGH (ref 6–20)
CO2: 26 mmol/L (ref 22–32)
Calcium: 9 mg/dL (ref 8.9–10.3)
Chloride: 107 mmol/L (ref 98–111)
Creatinine, Ser: 0.76 mg/dL (ref 0.61–1.24)
GFR, Estimated: >60 mL/min (ref >60)
Glucose, Bld: 300 mg/dL — ABNORMAL HIGH (ref 70–99)
Potassium: 5 mmol/L (ref 3.5–5.1)
Sodium: 140 mmol/L (ref 135–145)

## 2020-12-17 LAB — GLUCOSE, CAPILLARY
Glucose-Capillary: 328 mg/dL — ABNORMAL HIGH (ref 70–99)
Glucose-Capillary: 166 mg/dL — ABNORMAL HIGH (ref 70–99)
Glucose-Capillary: 111 mg/dL — ABNORMAL HIGH (ref 70–99)

## 2020-12-17 MED ORDER — GLUCERNA 1.5 CAL PO LIQD
1000.0000 mL | ORAL | Status: DC
  Administered 2020-12-17: 1000 mL

## 2020-12-17 MED ORDER — INSULIN ASPART 100 UNIT/ML IJ SOLN
0.0000 [IU] | INTRAMUSCULAR | Status: DC
  Administered 2020-12-17: 3 [IU] via SUBCUTANEOUS
  Administered 2020-12-18: 2 [IU] via SUBCUTANEOUS
  Filled 2020-12-17 (×2): qty 1

## 2020-12-17 MED ORDER — JUVEN PO PACK
1.0000 | PACK | Freq: Two times a day (BID) | ORAL | Status: DC
  Administered 2020-12-17 – 2020-12-18 (×2): 1

## 2020-12-17 MED ORDER — FREE WATER
75.0000 mL | Status: DC
  Administered 2020-12-17 – 2020-12-18 (×6): 75 mL

## 2020-12-17 NOTE — Progress Notes (Signed)
Inpatient Diabetes Program Recommendations  AACE/ADA: New Consensus Statement on Inpatient Glycemic Control (2015)  Target Ranges:  Prepandial:   less than 140 mg/dL      Peak postprandial:   less than 180 mg/dL (1-2 hours)      Critically ill patients:  140 - 180 mg/dL   Results for ISIAHA, GREENUP (MRN [id number]) as of 12/17/2020 08:13  Ref. Range 12/16/2020 18:34 12/16/2020 18:49 12/16/2020 19:10 12/16/2020 21:33 12/17/2020 07:50  Glucose-Capillary Latest Ref Range: 70 - 99 mg/dL 43 (LL) 61 (L) 67 (L) 283 (H) 328 (H)  7 units NOVOLOG      Admit Cutaneous abscess of the bilateral axilla secondary to chronic contractures and history of hidradenitis suppurativa  History: DM, Sacral ulcers, Hypoxic brain injury, PEG tube and Trach   Home DM Meds: Lantus 25 units Daily        Novolog 0-10 units TID       Also gets Tube Feeds at home  Current Orders: Lantus 25 units QHS       Novolog 0-9 units TID ac/hs   HYPO yest at 6pm (CBG 43).   NO Insulin had been given by the hospital prior to the HYPO event.   Started Tube Feeds 65cc/hr shortly after HYPO around 7pm.   MD- Please consider increasing the frequency of the Novolog SSI to Q4 hours while patient getting continuous tube feeds    --Will follow patient during hospitalization--  Ambrose Finland RN, MSN, CDE Diabetes Coordinator Inpatient Glycemic Control Team Team Pager: [phone] (8a-5p)

## 2020-12-17 NOTE — TOC Progression Note (Signed)
Transition of Care [facility]) - Progression Note    Patient Details  Name: Evan Good MRN: [id number] Date of Birth: 1970/07/23  Transition of Care [facility]) CM/SW [place], RN Phone Number: 12/17/2020, 3:14 PM  Clinical Narrative:   Met with the patient's wife in the room, she is very animate that the patient not return to [facility] where he resides for the past 2 months I explained to her that I can do a bed search for a new facility however once he is medically stable to DC if there are not any other bed offers he will need to go back to [facility] and they can help him find a new facility, the wife stated that she will refuse to send him back, I agreed to do a bed search and she stated she did not want it to be in the [place] area but wants it in [place] area, I explained that will limit the ability to get a new bed offer, she stated understanding and stated that she will not allow her husband to go back to [facility], I explained that the other option would be to go home and she care for him, she stated that she can't do that.  FL2 completed, PASSR obtained, Bedsearch sent in the [facility] area         Expected Discharge Plan and Services                                                 Social Determinants of Health (SDOH) Interventions    Readmission Risk Interventions No flowsheet data found.

## 2020-12-17 NOTE — Progress Notes (Signed)
Initial Nutrition Assessment  DOCUMENTATION CODES:  Non-severe (moderate) malnutrition in context of chronic illness  INTERVENTION:  Continue NPO diet at this time  Continue TF via PEG tube. Adjust regimen as follows:  Glucerna 1.5 at 74mL/h  Free water: 60mL free water q4h  This provides: 2160kcal, 119g of protein, and free water (TF+flush)  Request new measured weight  Administer 1 packet Juven BID via tube, each packet provides 95 calories, 2.5 grams of protein (collagen), and 9.8 grams of carbohydrate (3 grams sugar); also contains 7 grams of L-arginine and L-glutamine, 300 mg vitamin C, 15 mg vitamin E, 1.2 mcg vitamin B-12, 9.5 mg zinc, 200 mg calcium, and 1.5 g  Calcium Beta-hydroxy-Beta-methylbutyrate to support wound healing  NUTRITION DIAGNOSIS:  Moderate Malnutrition related to chronic illness (COPD, s/p gastric bypass, CHF) as evidenced by moderate fat depletion,mild fat depletion,moderate muscle depletion,severe muscle depletion.  GOAL:  Patient will meet greater than or equal to 90% of their needs  MONITOR:  TF tolerance,Labs,Weight trends,Skin  REASON FOR ASSESSMENT:  Consult Assessment of nutrition requirement/status  ASSESSMENT:  Pt presented to ED from facility [facility]) for evaluation of pressure ulcers and abscess to buttocks and armpits. Patient is mostly nonverbal due to Hx anoxic brain injury. PEG tube and tracheostomy in place since 02/01/20. Recent Admission on 4/17-4/21, followed by RD during earlier admissions. PMH includes DMT2, COPD, chronic ulcers, s/p gastric bypass, CHF, hypoxic brain injury.  Pt sleeping at the time of assessment. Family at bedside able to provide some history. States that she is unsure of the TF regimen that pt is on at his facility. Does feel that he has declined since he was placed. Does not speak or express needs at baseline. Family states that he is more lethargic than usual today.   Wounds present of  admission, will adjust TF formula to better control glucose levels and also add Juven BID to promote wound healing.  Nutritionally Relevant Medications: Scheduled Meds: . cholecalciferol  1,000 Units Per J Tube Daily  . famotidine  20 mg Per Tube Daily  . folic acid  1 mg Per Tube Daily  . insulin aspart  0-5 Units Subcutaneous QHS  . insulin aspart  0-9 Units Subcutaneous TID WC  . insulin glargine  25 Units Subcutaneous QHS  . multivitamin with minerals  1 tablet Per Tube Daily  . thiamine  100 mg Per Tube Daily  . vitamin B-12  1,000 mcg Per Tube Daily  . zinc sulfate  220 mg Oral Daily   PRN Meds: ondansetron  Labs reviewed:  BUN 48  SBG ranges from 43-328 mg/dL over the last 24 hours  HgbA1c 7.3% (4/19)  NUTRITION - FOCUSED PHYSICAL EXAM: Flowsheet Row Most Recent Value  Orbital Region Mild depletion  Upper Arm Region Moderate depletion  Thoracic and Lumbar Region Moderate depletion  Buccal Region Mild depletion  Temple Region Moderate depletion  Clavicle Bone Region Moderate depletion  Clavicle and Acromion Bone Region Moderate depletion  Scapular Bone Region Moderate depletion  Dorsal Hand Mild depletion  Patellar Region Severe depletion  Anterior Thigh Region Severe depletion  Posterior Calf Region Severe depletion  Edema (RD Assessment) None  Hair Reviewed  Eyes Unable to assess  Mouth Unable to assess  Skin Reviewed  Nails Reviewed     Diet Order:   Diet Order            Diet NPO time specified  Diet effective now  EDUCATION NEEDS:  No education needs have been identified at this time  Skin:  Skin Assessment: Skin Integrity Issues: Skin Integrity Issues:: Stage IV Stage IV: coccyx  Last BM:  6/7 - per RN documentation  Height:  Ht Readings from Last 1 Encounters:  12/16/20 5\' 11"  (1.803 m)    Weight:  Wt Readings from Last 1 Encounters:  12/16/20 65.8 kg    Ideal Body Weight:  78.2 kg  BMI:  Body mass index is 20.22  kg/m.  Estimated Nutritional Needs:   Kcal:  2100-2300 kcal/d  Protein:  105-120 g/d  Fluid:  2L/d for CHF, may need additional fluid to promote wound healing   02/15/21, RD, LDN Clinical Dietitian Pager on Amion

## 2020-12-17 NOTE — NC FL2 (Signed)
[place] MEDICAID FL2 LEVEL OF CARE SCREENING TOOL     IDENTIFICATION  Patient Name: Evan Good Birthdate: 24-Oct-1970 Sex: male Admission Date (Current Location): 12/16/2020  [place] and [place] Number:  Chiropodist and Address:  [facility], [address] [place], [place] [address]      Provider Number: [id number]  Attending Physician Name and Address:  Alberteen Sam, *  Relative Name and Phone Number:  Kalman Jewels [phone]    Current Level of Care: Other (Comment) Recommended Level of Care: Skilled Nursing Facility,Other (Comment) (Long term bed) Prior Approval Number:    Date Approved/Denied:   PASRR Number: [id number]  Discharge Plan: SNF (Long Term)    Current Diagnoses: Patient Active Problem List   Diagnosis Date Noted  . Cellulitis 12/16/2020  . Cutaneous abscesses of both axillae 12/16/2020  . History of anoxic brain injury 12/16/2020  . Malnutrition of moderate degree 10/28/2020  . Sepsis (HCC) 10/27/2020  . Sepsis associated hypotension (HCC) 10/26/2020  . Leukocytosis 10/26/2020  . Cystitis 10/26/2020  . Total self-care deficit 10/26/2020  . Feeding tube blocked 10/26/2020  . Pressure injury of skin 10/26/2020  . 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) 10/26/2020    Orientation RESPIRATION BLADDER Height & Weight        Normal External catheter Weight: 65 kg Height:  5\' 11"  (180.3 cm)  BEHAVIORAL SYMPTOMS/MOOD NEUROLOGICAL BOWEL NUTRITION STATUS      Incontinent Feeding tube  AMBULATORY STATUS COMMUNICATION OF NEEDS Skin   Total Care Does not communicate Other (Comment)                       Personal Care Assistance Level of Assistance  Total care       Total Care Assistance: Maximum assistance   Functional Limitations Info  Speech     Speech Info: Impaired (non verbal)    SPECIAL CARE FACTORS FREQUENCY                       Contractures  Contractures Info: Present    Additional Factors Info  Code Status,Allergies Code Status Info: full code Allergies Info: NKDA           Current Medications (12/17/2020):  This is the current hospital active medication list Current Facility-Administered Medications  Medication Dose Route Frequency Provider Last Rate Last Admin  . acetaminophen (TYLENOL) tablet 650 mg  650 mg Per Tube Q6H PRN Cox, Amy N, DO       Or  . acetaminophen (TYLENOL) suppository 650 mg  650 mg Rectal Q6H PRN Cox, Amy N, DO      . chlorhexidine (PERIDEX) 0.12 % solution 5 mL  5 mL Mouth/Throat BID Cox, Amy N, DO   5 mL at 12/17/20 0906  . Chlorhexidine Gluconate Cloth 2 % PADS 6 each  6 each Topical Q0600 Cox, Amy N, DO   6 each at 12/17/20 [phone]  . cholecalciferol (VITAMIN D3) tablet 1,000 Units  1,000 Units Per J Tube Daily Cox, Amy N, DO   1,000 Units at 12/17/20 0906  . famotidine (PEPCID) tablet 20 mg  20 mg Per Tube Daily Cox, Amy N, DO   20 mg at 12/17/20 0906  . feeding supplement (GLUCERNA 1.5 CAL) liquid 1,000 mL  1,000 mL Per Tube Continuous 02/16/21, MD 60 mL/hr at 12/17/20 1118 1,000 mL at 12/17/20 1118  . folic acid (FOLVITE) tablet 1 mg  1 mg Per Tube Daily Cox, Amy N, DO   1 mg at 12/17/20 0906  . free water 75 mL  75 mL Per Tube Q4H Danford, Earl Lites, MD   75 mL at 12/17/20 1118  . heparin injection 5,000 Units  5,000 Units Subcutaneous Q8H Cox, Amy N, DO   5,000 Units at 12/17/20 1336  . HYDROcodone-acetaminophen (NORCO/VICODIN) 5-325 MG per tablet 1 tablet  1 tablet Oral Q12H PRN Cox, Amy N, DO   1 tablet at 12/17/20 1203  . insulin aspart (novoLOG) injection 0-5 Units  0-5 Units Subcutaneous QHS Cox, Amy N, DO      . insulin aspart (novoLOG) injection 0-9 Units  0-9 Units Subcutaneous TID WC Cox, Amy N, DO   7 Units at 12/17/20 1203  . insulin glargine (LANTUS) injection 25 Units  25 Units Subcutaneous QHS Cox, Amy N, DO      . levETIRAcetam (KEPPRA) 100 MG/ML solution 1,000 mg   1,000 mg Per J Tube BID Cox, Amy N, DO   1,000 mg at 12/17/20 0905  . LORazepam (ATIVAN) injection 2 mg  2 mg Intravenous Q4H PRN Cox, Amy N, DO      . melatonin tablet 5 mg  5 mg Per Tube QHS Cox, Amy N, DO   5 mg at 12/16/20 2212  . multivitamin with minerals tablet 1 tablet  1 tablet Per Tube Daily Cox, Amy N, DO   1 tablet at 12/17/20 0906  . mupirocin ointment (BACTROBAN) 2 % 1 application  1 application Nasal BID Cox, Amy N, DO   1 application at 12/17/20 1117  . nutrition supplement (JUVEN) (JUVEN) powder packet 1 packet  1 packet Per Tube BID BM Danford, Earl Lites, MD   1 packet at 12/17/20 1336  . ondansetron (ZOFRAN) tablet 4 mg  4 mg Per Tube Q6H PRN Cox, Amy N, DO       Or  . ondansetron (ZOFRAN) injection 4 mg  4 mg Intravenous Q6H PRN Cox, Amy N, DO      . thiamine tablet 100 mg  100 mg Per Tube Daily Cox, Amy N, DO   100 mg at 12/17/20 0906  . vancomycin (VANCOCIN) IVPB 1000 mg/200 mL premix  1,000 mg Intravenous Q12H Tressie Ellis, RPH 200 mL/hr at 12/17/20 0905 1,000 mg at 12/17/20 0905  . vitamin B-12 (CYANOCOBALAMIN) tablet 1,000 mcg  1,000 mcg Per Tube Daily Cox, Amy N, DO   1,000 mcg at 12/17/20 0906  . zinc sulfate capsule 220 mg  220 mg Oral Daily Cox, Amy N, DO   220 mg at 12/17/20 0932     Discharge Medications: Please see discharge summary for a list of discharge medications.  Relevant Imaging Results:  Relevant Lab Results:   Additional Information [id number]  Barrie Dunker, RN

## 2020-12-17 NOTE — Plan of Care (Signed)

## 2020-12-17 NOTE — Progress Notes (Signed)
[facility] PROGRESS NOTE    Evan Good  GXQ:[id number] DOB: [birth year] DOA: 12/16/2020 PCP: Patrecia Pour, MD      Brief Narrative:  Mr. Buresh is a 50 y.o. M with History of C. 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      Assessment & Plan:  Cellulitis superimposed on pyoderma gangrenosum -Continue IV antibiotics    Seizures -Continue Keppra  Sacrum stage 4 pressure ulcer -Consult WOC  Diabetes Glucoses labile -Continue lantus -Change SSI to q4  Severe protein calorie malnutrition  History of anoxic brain injury secondary to presumed hypoglycemia and nutritional deficiency in setting of Roux-en-Y gastric bypass procedure -Continue tube feeds, free water   Anemia Hgb stable relative to baseline      Disposition: Status is: Observation  The patient will require care spanning > 2 midnights and should be moved to inpatient because: still with induration and drainage around pyoderma, also still tachycardic to 100s  Dispo: The patient is from: SNF              Anticipated d/c is to: SNF              Patient currently is not medically stable to d/c.   Difficult to place patient No       Level of care: Med-Surg       MDM: The below labs and imaging  reports were reviewed and summarized above.  Medication management as above.   DVT prophylaxis: heparin injection 5,000 Units Start: 12/16/20 2200 Place TED hose Start: 12/16/20 1509  Code Status:  FULL Family Communication: Wife by phone            Subjective: Patient is nonverbal.  He has had no fever.  Nursing staff noted no respiratory distress, vomiting, pain complaints.  Objective: Vitals:   12/17/20 0458 12/17/20 0748 12/17/20 1151 12/17/20 1230  BP: 102/65 101/73 107/80   Pulse: (!) 105 97 (!) 106   Resp: 17 16 18    Temp: 98.3 F (36.8 C) 98 F (36.7 C) 97.8 F (36.6 C)   TempSrc: Oral     SpO2: 97% 100% (!) 86%   Weight:    65 kg  Height:        Intake/Output Summary (Last 24 hours) at 12/17/2020 1541 Last data filed at 12/17/2020 0501 Gross per 24 hour  Intake 1000 ml  Output 350 ml  Net 650 ml   Filed Weights   12/16/20 1208 12/17/20 1230  Weight: 65.8 kg 65 kg    Examination: General appearance:  adult male, awake, makes eye contact, no obvious distress.   HEENT: Anicteric, conjunctiva pink, lids and lashes normal. No nasal deformity, discharge, epistaxis.   Skin: Warm and dry.  no jaundice.  He has draining tracts in both armpits, with some mild surrounding erythema and induration, the sacral wound is clean without surrounding cellulitis Cardiac: Tachycardic, regular, nl S1-S2, no murmurs appreciated.  Capillary refill is brisk.  JVP ot visible.  No LE  edema.  Radial pulses 2+ and symmetric. Respiratory: Normal respiratory rate and rhythm.  CTAB without rales or wheezes. Abdomen: Abdomen soft.  no TTP. No ascites, distension, hepatosplenomegaly.   MSK: No deformities or effusions.  Diffuse loss of subcutaneous muscle mass and fat Neuro: Awake and makes eye contact, but is nonverbal.  Contractures in all 4 extremities,.  Does not follow commands Psych: To assess    Data Reviewed: I have personally reviewed following labs and imaging  studies:  CBC: Recent Labs  Lab 12/16/20 1210 12/17/20 0606  WBC 12.6* 9.4  NEUTROABS 9.0*  --   HGB 8.6* 8.8*  HCT 28.9* 29.2*  MCV 90.0 90.1  PLT 424* 345   Basic Metabolic Panel: Recent Labs  Lab 12/16/20 1717 12/17/20 0606  NA  --  140  K  --  5.0  CL  --  107  CO2  --  26  GLUCOSE  --  300*  BUN  --  48*  CREATININE  --  0.76  CALCIUM  --  9.0  MG 2.5*  --   PHOS 4.3  --    GFR: Estimated Creatinine Clearance: 102.7 mL/min (by C-G formula based on SCr of 0.76 mg/dL). Liver Function Tests: No results for input(s): AST, ALT, ALKPHOS, BILITOT, PROT, ALBUMIN in the last 168 hours. No results for input(s): LIPASE, AMYLASE in the last 168 hours. No results for input(s): AMMONIA in the last 168 hours. Coagulation Profile: No results for input(s): INR, PROTIME in the last 168 hours. Cardiac Enzymes: No results for input(s): CKTOTAL, CKMB, CKMBINDEX, TROPONINI in the last 168 hours. BNP (last 3 results) No results for input(s): PROBNP in the last 8760 hours. HbA1C: No results for input(s): HGBA1C in the last 72 hours. CBG: Recent Labs  Lab 12/16/20 1834 12/16/20 1849 12/16/20 1910 12/16/20 2133 12/17/20 0750  GLUCAP 43* 61* 67* 147* 328*   Lipid Profile: No results for input(s): CHOL, HDL, LDLCALC, TRIG, CHOLHDL, LDLDIRECT in the last 72 hours. Thyroid Function Tests: No results for input(s): TSH, T4TOTAL, FREET4, T3FREE, THYROIDAB in the last 72 hours. Anemia Panel: No results for input(s): VITAMINB12, FOLATE, FERRITIN, TIBC, IRON, RETICCTPCT in the last 72 hours. Urine analysis:    Component Value Date/Time   COLORURINE YELLOW (A) 10/26/2020 1220   APPEARANCEUR CLOUDY (A) 10/26/2020 1220   LABSPEC 1.015 10/26/2020 1220   PHURINE 8.0 10/26/2020 1220   GLUCOSEU NEGATIVE 10/26/2020 1220   HGBUR MODERATE (A) 10/26/2020 1220   BILIRUBINUR NEGATIVE 10/26/2020 1220   KETONESUR NEGATIVE 10/26/2020 1220   PROTEINUR 30 (A) 10/26/2020 1220   NITRITE NEGATIVE  10/26/2020 1220   LEUKOCYTESUR LARGE (A) 10/26/2020 1220   Sepsis Labs: @LABRCNTIP (procalcitonin:4,lacticacidven:4)  ) Recent Results (from the past 240 hour(s))  MRSA PCR Screening     Status: Abnormal   Collection Time: 12/16/20  5:15 PM   Specimen: Nasopharyngeal  Result Value Ref Range Status   MRSA by PCR POSITIVE (A) NEGATIVE Final    Comment:        The GeneXpert MRSA Assay (FDA approved for NASAL specimens only), is one component of a comprehensive MRSA colonization surveillance program. It is not intended to diagnose MRSA infection nor to guide or monitor treatment for MRSA infections. RESULT CALLED TO, READ BACK BY AND VERIFIED WITH: ALESA THOMAS @2040  ON 12/16/20 SKL Performed at [facility], [address], [place], [address] [place]          Radiology Studies: DG Chest Portable 1 View  Result  Date: 12/16/2020 CLINICAL DATA:  Axillary infections EXAM: PORTABLE CHEST 1 VIEW COMPARISON:  October 26, 2020 FINDINGS: The lungs are clear. The heart is upper normal in size with pulmonary vascularity normal. No adenopathy. There is arthropathy in each shoulder. No abnormal air in either axillary region. IMPRESSION: No edema or airspace opacity.  Heart upper normal in size. Electronically Signed   By: Bretta Bang III M.D.   On: 12/16/2020 15:26        Scheduled Meds: . chlorhexidine  5 mL Mouth/Throat BID  . Chlorhexidine Gluconate Cloth  6 each Topical Q0600  . cholecalciferol  1,000 Units Per J Tube Daily  . famotidine  20 mg Per Tube Daily  . folic acid  1 mg Per Tube Daily  . free water  75 mL Per Tube Q4H  . heparin  5,000 Units Subcutaneous Q8H  . insulin aspart  0-15 Units Subcutaneous Q4H  . insulin glargine  25 Units Subcutaneous QHS  . levETIRAcetam  1,000 mg Per J Tube BID  . melatonin  5 mg Per Tube QHS  . multivitamin with minerals  1 tablet Per Tube Daily  . mupirocin ointment  1 application Nasal BID  . nutrition supplement  (JUVEN)  1 packet Per Tube BID BM  . thiamine  100 mg Per Tube Daily  . vitamin B-12  1,000 mcg Per Tube Daily  . zinc sulfate  220 mg Oral Daily   Continuous Infusions: . feeding supplement (GLUCERNA 1.5 CAL) 1,000 mL (12/17/20 1118)  . vancomycin 1,000 mg (12/17/20 0905)     LOS: 0 days    Time spent: 25 minutes    Alberteen Sam, MD [facility] 12/17/2020, 3:41 PM     Please page though AMION or Epic secure chat:  For [employer], Higher education careers adviser

## 2020-12-17 NOTE — Consult Note (Signed)
WOC Nurse wound consult note Consultation was completed by review of records, images and assistance from the bedside nurse/clinical staff.   Reason for Consult: Patient bedbound, contracted, anoxic brain injury.  From SNF History of hydradenitis in the bilateral axilla and chronic sacral pressure injury Wound type: Open lesions bilateral axilla; full thickness; right axilla with hypergranulation tissue related to moisture Stage 4 Pressure injury; sacrum Pin point opening right buttock; full thickness Pressure Injury POA: Yes Measurement: see nursing flowsheets  Wound bed: All areas appear clean, no overt signs of infection. However there is thick yellow pus from the lesion on the right buttock.  This was also the case in his previous admission in April 2022.  Drainage (amount, consistency, odor) purulent drainage from the bilateral axilla is the reason for admission.  It should be noted most patients with suppurativa hydradenitis have foul drainage, sometimes thick.   Periwound:intact evidence of scarring on the sacrum from re-epithelization; wound was larger at the last admission per reviewed images   Dressing procedure/placement/frequency: Add low air loss mattress for moisture management and pressure redistribution  Clean sacral wound with saline, pat dry. Fill sacral wound with saline moist gauze, top with ABD pad, secure with tape.   Cut strip of silver hydrofiber (Aquacel Ag+) and use stick end of sterile applicator to pack tunneled open area on the right buttock, leaving wick of silver hydrofiber outside the opening. To wick drainage from site, cover with ABD pad. Secure with tape. Change daily.  Use toomey syringe with saline to flush areas of ulcerations under the bilateral axilla, dry. Use sheets of silver hydrofiber (Aquacel Ag+) to place on wounds. Cover with ABD pads, secure with tape if needed. Change daily.   Challenging situation, normally I would recommend patient for an  evaluation for the hydradenitis however not sure that aggressive of care is desired. I have listed resources if patient/family wishes to pursue work up  [facility]  Dr. Tammi [facility] Information/Appointments [address] [place], [place] [address] [phone] ofc.  [phone] fax   Department of Dermatology Dr. Brayton Caves  [facility], [facility] [address] [place], [place], [place] [address] [facility] [address] [place], [place] [address] To make, change or cancel an appointment please contact the dermatology appointment center at [phone].   [facility] [phone] - [place]cy P[place] [phone] - [address] [phone] [place] [address] Phone number:  [phone] - Fax Number   Re consult if needed, will not follow at this time. Thanks  Sian Rockers [employer], RN,CWOCN, CNS, CWON-AP [phone])

## 2020-12-18 LAB — GLUCOSE, CAPILLARY
Glucose-Capillary: 328 mg/dL — ABNORMAL HIGH (ref 70–99)
Glucose-Capillary: 134 mg/dL — ABNORMAL HIGH (ref 70–99)
Glucose-Capillary: 76 mg/dL (ref 70–99)
Glucose-Capillary: 71 mg/dL (ref 70–99)
Glucose-Capillary: 81 mg/dL (ref 70–99)
Glucose-Capillary: 127 mg/dL — ABNORMAL HIGH (ref 70–99)

## 2020-12-18 LAB — CREATININE, SERUM
Creatinine, Ser: 0.71 mg/dL (ref 0.61–1.24)
GFR, Estimated: >60 mL/min (ref >60)

## 2020-12-18 MED ORDER — CEPHALEXIN 500 MG PO CAPS: 500.0000 mg | ORAL_CAPSULE | Freq: Four times a day (QID) | ORAL | 0 refills | Status: AC

## 2020-12-18 MED ORDER — HYDROCODONE-ACETAMINOPHEN 5-325 MG PO TABS: 1.0000 | ORAL_TABLET | Freq: Two times a day (BID) | ORAL | 0 refills | Status: DC | PRN

## 2020-12-18 NOTE — Plan of Care (Signed)
  Problem: Education: Goal: Knowledge of General Education information will improve Description: Including pain rating scale, medication(s)/side effects and non-pharmacologic comfort measures Outcome: Progressing   Problem: Health Behavior/Discharge Planning: Goal: Ability to manage health-related needs will improve Outcome: Progressing   Problem: Clinical Measurements: Goal: Ability to maintain clinical measurements within normal limits will improve Outcome: Progressing Goal: Will remain free from infection Outcome: Progressing Goal: Diagnostic test results will improve Outcome: Progressing Goal: Respiratory complications will improve Outcome: Progressing Goal: Cardiovascular complication will be avoided Outcome: Progressing   Problem: Nutrition: Goal: Adequate nutrition will be maintained Outcome: Progressing   Problem: Activity: Goal: Risk for activity intolerance will decrease Outcome: Progressing   Problem: Coping: Goal: Level of anxiety will decrease Outcome: Progressing   Problem: Elimination: Goal: Will not experience complications related to bowel motility Outcome: Progressing Goal: Will not experience complications related to urinary retention Outcome: Progressing   Problem: Safety: Goal: Ability to remain free from injury will improve Outcome: Progressing   Problem: Skin Integrity: Goal: Risk for impaired skin integrity will decrease Outcome: Progressing   

## 2020-12-18 NOTE — Plan of Care (Signed)
  Problem: Nutrition: Goal: Adequate nutrition will be maintained Outcome: Progressing   Problem: Elimination: Goal: Will not experience complications related to bowel motility Outcome: Progressing   Problem: Safety: Goal: Ability to remain free from injury will improve Outcome: Progressing   

## 2020-12-18 NOTE — Discharge Summary (Signed)
Physician Discharge Summary  Evan Good JQB:[id number] DOB: 10-05-1970 DOA: 12/16/2020  PCP: Patrecia Pour, MD  Admit date: 12/16/2020 Discharge date: 12/18/2020  Admitted From: SNF LTC Disposition:  SNF LTC  Recommendations for Outpatient Follow-up:  Follow up with [facility] Dermatology Hidradenitis Suppurativa clinic as soon as able      Home Health: N/A  Equipment/Devices: TBD  Discharge Condition: Stable  CODE STATUS: FULL Diet recommendation: Tube feeds  Brief/Interim Summary: Evan Good is a 50 y.o. 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 difficile colitis, Hidradenitis suppurativa vs. pyoderma gangrenosum, IDDM, chronic sacral ulcers, bilateral lower extremity sacral decubitus ulcer, seizures, sCHF who presents to the emergency department for chief concerns of purulence of the bilateral axillary region.         PRINCIPAL HOSPITAL DIAGNOSIS: Hidradenitis suppurativa with suspected superinfection    Discharge Diagnoses:   Cellulitis superimposed on hidradenitis suppurativa The patient has bilateral axillary draining ulcers, consistent with hidradenitis suppurativa.  There is also a sacral ulcer, which may or may not be associated with gluteal cleft tracts.    He presented with increased drainage, and was thought to have superimposed cellulitis by the admitting MD, started on antibiotics.  At this point, his tracts are without much surrounding cellulitis.  I will continue cephalexin for a 7 days course.  I strongly recommend Dermatology follow up as soon as possible.    Seizures  Sacrum stage 4 pressure ulcer, POA  Diabetes  Severe protein calorie  malnutrition Given severe loss of subcutaneous muscle mass and severe loss of subcutaneous fat.   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   cephALEXin 500 MG capsule Commonly known as: KEFLEX Place 1 capsule (500 mg total) into feeding tube 4 (four) times daily for 4 days.   chlorhexidine 0.12 % solution Commonly known as: PERIDEX 5 mL by Mouth route two (2) times a day.   Cholecalciferol 25 MCG (1000 UT) tablet 1,000 Units daily. Via g-tube   diclofenac Sodium 1 % Gel Commonly known as: VOLTAREN Apply 2 g topically every 6 (six) hours as needed (pain).   famotidine 20 MG tablet Commonly known as: PEPCID 20 mg daily. Via g-tube   feeding supplement (OSMOLITE 1.5 CAL) Liqd Place 1,000 mLs into feeding tube continuous.   feeding supplement (PROSource TF) liquid Place 45 mLs into feeding tube 2 (two) times daily.   folic acid 1 MG tablet Commonly known as: FOLVITE 1 mg daily. Via g-tube   HYDROcodone-acetaminophen 5-325 MG tablet Commonly known as: NORCO/VICODIN Take 1 tablet by mouth every 12 (twelve) hours as needed for moderate pain or severe pain.   insulin aspart 100 UNIT/ML injection Commonly known as: novoLOG Inject 0-10 Units into the skin 3 (three) times daily before meals.   insulin glargine  100 UNIT/ML injection Commonly known as: LANTUS Inject 0.25 mLs (25 Units total) into the skin daily.   levETIRAcetam 1000 MG tablet Commonly known as: KEPPRA 1 tablet (1,000 mg total) by J-Tube route Two (2) times a day.    melatonin 3 MG Tabs tablet 3 mg at bedtime.   polyethylene glycol powder 17 GM/SCOOP powder Commonly known as: GLYCOLAX/MIRALAX 17 g by G-tube route two (2) times a day as needed (give throught g-tube).   Super Thera Vite M Tabs Take 1 tablet by mouth daily. Via g-tube   thiamine 100 MG tablet 100 mg daily. Via g-tube   vitamin B-12 1000 MCG tablet Commonly known as: CYANOCOBALAMIN Place 1 tablet (1,000 mcg total) into feeding tube daily.   zinc sulfate 220 (50 Zn) MG capsule Take 220 mg by mouth daily.        Follow-up Information     Leonette Nutting, MD. Schedule an appointment as soon as possible for a visit in 1 month(s).   Specialty: Dermatology Contact information: [address] [place] [address] [phone]                No Known Allergies      Procedures/Studies: DG Chest Portable 1 View  Result Date: 12/16/2020 CLINICAL DATA:  Axillary infections EXAM: PORTABLE CHEST 1 VIEW COMPARISON:  October 26, 2020 FINDINGS: The lungs are clear. The heart is upper normal in size with pulmonary vascularity normal. No adenopathy. There is arthropathy in each shoulder. No abnormal air in either axillary region. IMPRESSION: No edema or airspace opacity.  Heart upper normal in size. Electronically Signed   By: Bretta Bang III M.D.   On: 12/16/2020 15:26      Subjective: Nonverbal.  No fever, no respiratory distress, no vomiting.  Discharge Exam: Vitals:   12/18/20 0532 12/18/20 0750  BP: 94/68 110/74  Pulse: 99 100  Resp:  14  Temp: 97.9 F (36.6 C) 98.3 F (36.8 C)  SpO2: 100% 100%   Vitals:   12/17/20 1640 12/17/20 1935 12/18/20 0532 12/18/20 0750  BP: 103/75 112/73 94/68 110/74  Pulse: (!) 106 (!) 105 99 100  Resp: 16 18  14   Temp: 98.5 F (36.9 C) 98.5 F (36.9 C) 97.9 F (36.6 C) 98.3 F (36.8 C)  TempSrc:  Oral Oral Oral  SpO2: 96% 100% 100% 100%  Weight:      Height:        General: Pt wakes and makes eye  contact, smiles.  No agitation.    Cardiovascular: RRR, nl S1-S2, no murmurs appreciated.   No LE edema.   Respiratory: Normal respiratory rate and rhythm.  CTAB without rales or wheezes. Skin: The axillary lesions are without surrounding cellulitis, induration or redness.  They have mild drainage, typical for HS. Neuro/Psych: Nonverbal.  Contracted in all four extremities.   The results of significant diagnostics from this hospitalization (including imaging, microbiology, ancillary and laboratory) are listed below for reference.     Microbiology: Recent Results (from the past 240 hour(s))  MRSA PCR Screening     Status: Abnormal   Collection Time: 12/16/20  5:15 PM   Specimen: Nasopharyngeal  Result Value Ref Range Status   MRSA by PCR POSITIVE (A) NEGATIVE Final    Comment:        The GeneXpert MRSA Assay (FDA approved for NASAL specimens only), is one component of a comprehensive MRSA colonization surveillance program. It is not intended to diagnose MRSA infection nor  to guide or monitor treatment for MRSA infections. RESULT CALLED TO, READ BACK BY AND VERIFIED WITH: ALESA THOMAS @2040  ON 12/16/20 SKL Performed at [facility], [address] [place], [place]      Labs: BNP (last 3 results) No results for input(s): BNP in the last 8760 hours. Basic Metabolic Panel: Recent Labs  Lab 12/16/20 1717 12/17/20 0606 12/18/20 0549  NA  --  140  --   K  --  5.0  --   CL  --  107  --   CO2  --  26  --   GLUCOSE  --  300*  --   BUN  --  48*  --   CREATININE  --  0.76 0.71  CALCIUM  --  9.0  --   MG 2.5*  --   --   PHOS 4.3  --   --    Liver Function Tests: No results for input(s): AST, ALT, ALKPHOS, BILITOT, PROT, ALBUMIN in the last 168 hours. No results for input(s): LIPASE, AMYLASE in the last 168 hours. No results for input(s): AMMONIA in the last 168 hours. CBC: Recent Labs  Lab 12/16/20 1210 12/17/20 0606  WBC 12.6* 9.4  NEUTROABS 9.0*   --   HGB 8.6* 8.8*  HCT 28.9* 29.2*  MCV 90.0 90.1  PLT 424* 345   Cardiac Enzymes: No results for input(s): CKTOTAL, CKMB, CKMBINDEX, TROPONINI in the last 168 hours. BNP: Invalid input(s): POCBNP CBG: Recent Labs  Lab 12/17/20 2036 12/18/20 0137 12/18/20 0459 12/18/20 0633 12/18/20 0750  GLUCAP 111* 134* 76 71 81   D-Dimer No results for input(s): DDIMER in the last 72 hours. Hgb A1c No results for input(s): HGBA1C in the last 72 hours. Lipid Profile No results for input(s): CHOL, HDL, LDLCALC, TRIG, CHOLHDL, LDLDIRECT in the last 72 hours. Thyroid function studies No results for input(s): TSH, T4TOTAL, T3FREE, THYROIDAB in the last 72 hours.  Invalid input(s): FREET3 Anemia work up No results for input(s): VITAMINB12, FOLATE, FERRITIN, TIBC, IRON, RETICCTPCT in the last 72 hours. Urinalysis    Component Value Date/Time   COLORURINE YELLOW (A) 10/26/2020 1220   APPEARANCEUR CLOUDY (A) 10/26/2020 1220   LABSPEC 1.015 10/26/2020 1220   PHURINE 8.0 10/26/2020 1220   GLUCOSEU NEGATIVE 10/26/2020 1220   HGBUR MODERATE (A) 10/26/2020 1220   BILIRUBINUR NEGATIVE 10/26/2020 1220   KETONESUR NEGATIVE 10/26/2020 1220   PROTEINUR 30 (A) 10/26/2020 1220   NITRITE NEGATIVE 10/26/2020 1220   LEUKOCYTESUR LARGE (A) 10/26/2020 1220   Sepsis Labs Invalid input(s): PROCALCITONIN,  WBC,  LACTICIDVEN Microbiology Recent Results (from the past 240 hour(s))  MRSA PCR Screening     Status: Abnormal   Collection Time: 12/16/20  5:15 PM   Specimen: Nasopharyngeal  Result Value Ref Range Status   MRSA by PCR POSITIVE (A) NEGATIVE Final    Comment:        The GeneXpert MRSA Assay (FDA approved for NASAL specimens only), is one component of a comprehensive MRSA colonization surveillance program. It is not intended to diagnose MRSA infection nor to guide or monitor treatment for MRSA infections. RESULT CALLED TO, READ BACK BY AND VERIFIED WITH: ALESA THOMAS @2040  ON 12/16/20  SKL Performed at [facility], [address] [place], [address] [place]      Time coordinating discharge: 25 minutes The [place] controlled substances registry was reviewed for this patient prior to filling the <5 days supply controlled substances script.    30  Day Unplanned Readmission Risk Score    Flowsheet Row ED to Hosp-Admission (Current) from 12/16/2020 in [facility] (1A)  30 Day Unplanned Readmission Risk Score (%) 16.96 Filed at 12/18/2020 0801       This score is the patient's risk of an unplanned readmission within 30 days of being discharged (0 -100%). The score is based on dignosis, age, lab data, medications, orders, and past utilization.   Low:  0-14.9   Medium: 15-21.9   High: 22-29.9   Extreme: 30 and above             SIGNED:   Alberteen Samhristopher P Dashana Guizar, MD  [facility] 12/18/2020, 9:29 AM

## 2020-12-18 NOTE — TOC Progression Note (Addendum)
Transition of Care [facility]) - Progression Note    Patient Details  Name: Evan Good MRN: [id number] Date of Birth: [birth year]  Transition of Care [facility]) CM/SW Contact  Barrie Dunker, RN Phone Number: 12/18/2020, 3:39 PM  Clinical Narrative:      NO bed offers at this time, the facility [facility] can try to find a new facility once the patient returns to there, he is a long term resident there  The patient will DC back to [facility] today, Tonya at [facility] aware and agrees DC summary sent  Called the wife and explained that [facility] can try to find another facility to accept the patient  She agrees Called EMS to pick up the patient  Expected Discharge Plan and Services                                                 Social Determinants of Health (SDOH) Interventions    Readmission Risk Interventions No flowsheet data found.

## 2020-12-22 ENCOUNTER — Other Ambulatory Visit: Payer: Self-pay

## 2020-12-22 ENCOUNTER — Non-Acute Institutional Stay: Payer: Medicaid Other | Admitting: Nurse Practitioner

## 2020-12-22 ENCOUNTER — Encounter: Payer: Self-pay | Admitting: Nurse Practitioner

## 2020-12-22 VITALS — BP 118/62 | HR 88 | Temp 98.3°F | Resp 18 | Wt 158.0 lb

## 2020-12-22 DIAGNOSIS — Z515 Encounter for palliative care: Secondary | ICD-10-CM

## 2020-12-22 DIAGNOSIS — R5381 Other malaise: Secondary | ICD-10-CM

## 2020-12-22 DIAGNOSIS — E440 Moderate protein-calorie malnutrition: Secondary | ICD-10-CM

## 2020-12-22 NOTE — Progress Notes (Signed)
[facility] Palliative Care Consult Note Telephone: [phone]  Fax: [phone]    Date of encounter: 12/22/20 PATIENT NAME: Evan Good [address] [place] [address]   [phone] (home)  DOB: 30-Oct-1970 MRN: [id number] PRIMARY CARE PROVIDER:   Dr Lucianne Lei [facility] Vicie Mutters, MD,  [place] [address] [phone]  RESPONSIBLE PARTY:    Contact Information     Name Relation Home Work [place], [place]        I met face to face with patient in facility. Palliative Care was asked to follow this patient by consultation request of  Dr Nona Dell to address advance care planning and complex medical decision making. This is a follow up visit. ASSESSMENT AND PLAN / RECOMMENDATIONS:  Symptom Management/Plan: 1. ACP; Full code with full scope of treatment; Hope is to have Evan Good transferred to LTC facility closer to [name]., Evan Good continues to work on that goal.    2. Debility with malnutrition secondary to COPD, chf, seizures, diabetes, malnutrition, anemia,  history of C difficile colitis, pyoderma gangrenosum,  History of gastric bypass, hypoxic brain injury secondary to presumed hypoglycemia, nutritional deficiency in the setting of roux-en-y gastric bypass sgy s/p peg;  continue to encourage hoyer oob, restorative exercises   3. Palliative care encounter; Palliative care encounter; Palliative medicine team will continue to support patient, patient's family, and medical team. Visit consisted of counseling and education dealing with the complex and emotionally intense issues of symptom management and palliative care in the setting of serious and potentially life-threatening illness  Follow up Palliative Care Visit: Palliative care will continue to follow for complex medical decision making, advance care planning, and clarification of goals.  Return 4 weeks or prn.  I spent 65 minutes providing this consultation. More than 50% of the time in this consultation was spent in counseling and care coordination.  PPS: 30%  HOSPICE ELIGIBILITY/DIAGNOSIS: TBD  Chief Complaint: Follow up palliative follow up consult for complex medical decision making  HISTORY OF PRESENT ILLNESS:  Evan Good is a 50 y.o. 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 Hospitalised 10/26/2020 to 10/30/2020 for Sepsis with hypertension elevated WBC source was unclear could be skin lesions chronic hidradenitis in both armpits And or chronic decubitis. History of seizures resumed Keppra. On insulin therapy for diabetes. Chronic sacral decubitis. Chronic tube feeding. Palliative console completed high risk for recurrent admissions giving chronic comorbidities with poor prognosis. Palliative did see during hospitalization for 10/28/2020 Per documentation he was at [facility] for about 10 months discharged to rehab facility in the middle of the hospitalization was readmitted, cardiac arrest and coma. MOST form completed with wishes for full code, aggressive interventions, full scope of treatment. Evan Good was discharged to [place] at [facility]. Evan Good was Hospitalized 12/16/2020 to 12/18/2020 cellulitis superimposed on hidradenitis suppurative bilateral axillary draining ulcers consistent with hidradenitis suppurative also sacral ulcer with stage 4 pressure ulcer. Severe protein calorie malnutrition given severe loss of subcutaneous muscle mass and severe loss of subcutaneous fat.  Evan Good was returned to SNF at [facility].  Staff endorses Evan Good requires total care for mobility, transfers as he is hoyer to recliner chair, turning, positioning, bathing, dressing, incontinence bowel and bladder. Fed though tube feedings essential to sustain life. Staff endorses no new changes or concerns. At present Evan Good is lying in bed, smiling. Evan Good appears decompensated, debilitated, chronically ill, no distress. No visitors present. I visited and observed Evan Good. Evan Good makes eye contact, blinks to questions, nods his head to answer questions. Evan Good nodded no when asked about symptoms of pain, shortness of breath. Evan Good was cooperative with assessment. Limited interaction as most pc visit supportive. I called Evan Good. Clinical updated discussed. We talked about PC visit with Evan Good. We talked about symptoms, recent hospitalizations, medical goals of care. Evan Good endorses she continue to try to look for a facility to transfer Evan. Evan closer to her location. We talked about no new changes recommended to poc. Discussed f/u pc visit and role. I updated nursing no new changes today to poc.   History obtained from review of EMR, discussion with facility and Evan. Good.  I reviewed available labs, medications, imaging, studies and related documents from the EMR.  Records reviewed and summarized above.   ROS Full 14 system review of systems performed and negative with exception of: as per HPI.   Physical Exam: Constitutional: NAD General: frail appearing, thin, decompensated male EYES: lids intact ENMT: oral mucous membranes moist, CV: S1S2, RRR, no LE edema Pulmonary: decrease bases,  no increased work of breathing Abdomen: normo-active BS + 4 quadrants, soft and non tender MSK: bed-bound with muscle wasting Skin: warm and dry, no rashes or wounds on visible skin Neuro:  + generalized weakness,  + cognitive  impairment Psych: non-anxious affect, A and Oriented to person  Questions and concerns were addressed.  Provided general support and encouragement, no other unmet needs identified   Thank you for the opportunity to participate in the care of Evan. Licausi.  The palliative care team will continue to follow. Please call our office at [phone] if we can be of additional assistance.   This chart was dictated using voice recognition software.  Despite best efforts to proofread,  errors can occur which can change the documentation meaning.  Inayah Woodin Ihor Gully, NP , MSN, [facility]

## 2021-02-04 ENCOUNTER — Encounter: Payer: Self-pay | Admitting: *Deleted

## 2021-02-04 ENCOUNTER — Ambulatory Visit: Payer: Medicaid Other | Admitting: Gastroenterology

## 2021-04-17 ENCOUNTER — Encounter: Payer: Self-pay | Admitting: Nurse Practitioner

## 2021-04-17 ENCOUNTER — Other Ambulatory Visit: Payer: Self-pay

## 2021-04-17 ENCOUNTER — Non-Acute Institutional Stay: Payer: Medicare Other | Admitting: Nurse Practitioner

## 2021-04-17 VITALS — BP 154/58 | HR 78 | Temp 97.0°F | Resp 18 | Wt 145.5 lb

## 2021-04-17 DIAGNOSIS — R5381 Other malaise: Secondary | ICD-10-CM

## 2021-04-17 DIAGNOSIS — E440 Moderate protein-calorie malnutrition: Secondary | ICD-10-CM

## 2021-04-17 DIAGNOSIS — Z515 Encounter for palliative care: Secondary | ICD-10-CM

## 2021-04-17 NOTE — Progress Notes (Signed)
[facility] Palliative Care Consult Note Telephone: [phone]  Fax: [phone]    Date of encounter: 04/17/21 3:41 PM PATIENT NAME: Evan Good [address] [place] [address]   [phone] (home)  DOB: 07-Aug-1970 MRN: [id number] PRIMARY CARE PROVIDER:   Dr Evan Good, Evan Mutters, MD,  Evan Good [id number] [phone]  RESPONSIBLE PARTY:    Contact Information     Name Relation Home Work Evan Good, Evan Good        I met face to face with patient and family in facility. Palliative Care was asked to follow this patient by consultation request of  Dr Evan Good to address advance care planning and complex medical decision making. This is a follow up visit.                                  ASSESSMENT AND PLAN / RECOMMENDATIONS:  Symptom Management/Plan: 1. ACP; Full code with full scope of treatment; Hope is to have Evan Good transferred to LTC facility closer to Evan Good., Evan Good continues to work on that goal.    2. 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 Palliative care encounter; Palliative care encounter; Palliative medicine team will continue to support patient, patient's family, and medical team. Visit consisted of counseling and education dealing with the complex and emotionally intense issues of symptom management and palliative care in the setting of serious and potentially life-threatening illness  Follow up  Palliative Care Visit: Palliative care will continue to follow for complex medical decision making, advance care planning, and clarification of goals. Return 8 weeks or prn.  I spent 38 minutes providing this consultation starting at 12:38pm. More than 50% of the time in this consultation was spent in counseling and care coordination. PPS: 30% Chief Complaint: Follow up palliative consult for complex medical decision making  HISTORY OF PRESENT ILLNESS:  Evan Good is a 50 y.o. 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 Hospitalised 10/26/2020 to 10/30/2020 for Sepsis with hypertension elevated WBC source was unclear could be skin lesions chronic hidradenitis in both armpits And or chronic decubitis. History of seizures resumed Keppra. On insulin therapy for diabetes. Chronic sacral decubitis. Chronic tube feeding. Palliative console completed high risk for recurrent admissions giving chronic comorbidities with poor prognosis. Palliative did see during hospitalization for 10/28/2020 Per documentation he was at [facility] for about 10 months discharged to rehab facility in the middle of the hospitalization was readmitted, cardiac arrest and coma. MOST form completed with wishes for full code, aggressive interventions, full scope of treatment. Mr Good was discharged to [facility]. Evan Good was Hospitalized 12/16/2020 to 12/18/2020 cellulitis superimposed on  hidradenitis suppurative bilateral axillary draining ulcers consistent with hidradenitis suppurative also sacral ulcer with stage 4 pressure ulcer.  Severe protein calorie malnutrition given severe loss of subcutaneous muscle mass and severe loss of subcutaneous fat. Evan Good was returned to SNF at [facility]. Staff endorses Evan Good requires total care for mobility, transfers as he is hoyer to recliner chair, turning, positioning, bathing, dressing, incontinence bowel and bladder. Fed though tube feedings essential to sustain life. Staff endorses no new changes or concerns. At present Evan Good is lying in bed, smiling. Evan Good appears decompensated, debilitated, chronically ill, no distress. No visitors present. I visited and observed Evan Good. Evan Good makes eye contact, blinks to questions, nods his head to answer questions. Evan Good nodded no when asked about symptoms of pain, shortness of breath. Evan Good was cooperative with assessment. Limited interaction as most pc visit supportive. Attempted to reach out to Ms. Eugenio Good, I updated staff, Evan Good appears currently stable. Will continue to follow with pc for support. Updated staff History obtained from review of EMR, discussion with facility staff and Evan Good.  I reviewed available labs, medications, imaging, studies and related documents from the EMR.  Records reviewed and summarized above.   ROS Full 10 system review of systems performed and negative with exception of: as per HPI.   Physical Exam: Constitutional: NAD General: frail appearing, debilitated, chronically ill, smiling male EYES: lids intact ENMT: oral mucous membranes moist CV: S1S2, RRR Pulmonary: LCTA, no increased work of breathing, no cough, room air Abdomen: normo-active BS + 4 quadrants, soft and non tender; g-tube MSK: bed-bound;  Skin: warm and dry, Neuro:  + generalized weakness,  + cognitive impairment; functionally quadriplegic Psych: non-anxious affect, A and Oriented to self; non-verbal  Questions and concerns were addressed. Provided general support and  encouragement, no other unmet needs identified   Thank you for the opportunity to participate in the care of Evan Good.  The palliative care team will continue to follow. Please call our office at [phone] if we can be of additional assistance.   This chart was dictated using voice recognition software.  Despite best efforts to proofread,  errors can occur which can change the documentation meaning.   Laurieanne Galloway Ihor Gully, NP

## 2021-08-14 ENCOUNTER — Other Ambulatory Visit: Payer: Self-pay

## 2021-08-14 ENCOUNTER — Encounter: Payer: Self-pay | Admitting: Nurse Practitioner

## 2021-08-14 ENCOUNTER — Non-Acute Institutional Stay: Payer: Medicare Other | Admitting: Nurse Practitioner

## 2021-08-14 DIAGNOSIS — R5381 Other malaise: Secondary | ICD-10-CM

## 2021-08-14 DIAGNOSIS — E440 Moderate protein-calorie malnutrition: Secondary | ICD-10-CM

## 2021-08-14 DIAGNOSIS — Z515 Encounter for palliative care: Secondary | ICD-10-CM

## 2021-08-14 NOTE — Progress Notes (Signed)
[place] Consult Note Telephone: [phone]  Fax: [phone]    Date of encounter: 08/14/21 8:32 PM PATIENT NAME: Evan Good [address] [place] [address]   [phone] (home)  DOB: March 23, 1971 MRN: [id number] PRIMARY CARE PROVIDER:    [facility]  RESPONSIBLE PARTY:    Contact Information     Name Relation Home Work Mobile   [place], [place]        I met face to face with patient in facility. Palliative Care was asked to follow this patient by consultation request of  [place] to address advance care planning and complex medical decision making. This is a follow up visit.                                  ASSESSMENT AND PLAN / RECOMMENDATIONS:  Symptom Management/Plan: 1. ACP; Full code with full scope of treatment;    2. 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 Palliative care encounter; Palliative care encounter; Palliative medicine team will continue to support patient, patient's family, and medical team. Visit consisted of counseling and education dealing with the complex and emotionally intense issues of symptom management and palliative care in the setting of serious and potentially life-threatening illness   Follow up Palliative Care Visit: Palliative care will continue to follow for complex medical decision making, advance care planning, and clarification of goals. Return 8 weeks or prn.  I spent 44 minutes providing this consultation. More than 50% of the time in this consultation was spent in counseling and care  coordination.  PPS: 30% Chief Complaint: Follow up palliative consult for complex medical decision making  HISTORY OF PRESENT ILLNESS:  Evan Good is a 51 y.o. 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 Hospitalised 10/26/2020 to 10/30/2020 for Sepsis with hypertension elevated WBC source was unclear could be skin lesions chronic hidradenitis in both armpits And or chronic decubitis. History of seizures resumed Keppra. On insulin therapy for diabetes. Chronic sacral decubitis. Chronic tube feeding. Palliative console completed high risk for recurrent admissions giving chronic comorbidities with poor prognosis. Palliative did see during hospitalization for 10/28/2020 Per documentation he was at [facility] for about 10 months discharged to rehab facility in the middle of the hospitalization was readmitted, cardiac arrest and coma. MOST form completed with wishes for full code, aggressive interventions, full scope of treatment. Evan Good was discharged to [place] at [facility]. Evan Good requires total care for mobility, transfers as he is hoyer to recliner chair, turning, positioning, bathing, dressing, incontinence bowel and bladder. Fed though tube feedings essential to sustain life. Staff endorses no new changes or concerns. At present Evan Good is lying in bed, smiling. Evan Good appears decompensated, debilitated, chronically ill, no distress. No visitors present. I visited and observed Evan.  Good. Evan Good makes eye contact, blinks to questions, nods his head to answer questions. Evan Good nodded  no when asked about symptoms of pain, shortness of breath. Evan Good was cooperative with assessment. Limited interaction as most pc visit supportive. Attempted to reach out to Ms. Eugenio Hoes, I updated staff, Evan Good appears currently stable. Will continue to follow with pc for support. Updated staff .   History obtained from review of EMR, discussion with facility staff and Evan Good.  I reviewed available labs, medications, imaging, studies and related documents from the EMR.  Records reviewed and summarized above.   ROS 10 point system reviewed with facility staff all negative except HPI  Physical Exam: Constitutional: NAD General: frail appearing, debilitated, chronically ill, smiling male EYES: lids intact ENMT: oral mucous membranes moist CV: S1S2, RRR Pulmonary: LCTA, no increased work of breathing, no cough, room air Abdomen: normo-active BS + 4 quadrants, soft and non tender; g-tube MSK: bed-bound;  Skin: warm and dry, Neuro:  + generalized weakness,  + cognitive impairment; functionally quadriplegic Psych: non-anxious affect, A and Oriented to self; non-verbal  Thank you for the opportunity to participate in the care of Evan Good.  The palliative care team will continue to follow. Please call our office at [phone] if we can be of additional assistance.   Jamus Loving Ihor Gully, NP

## 2021-09-25 ENCOUNTER — Other Ambulatory Visit: Payer: Self-pay

## 2021-09-25 ENCOUNTER — Encounter: Payer: Self-pay | Admitting: Nurse Practitioner

## 2021-09-25 ENCOUNTER — Non-Acute Institutional Stay: Payer: Medicare Other | Admitting: Nurse Practitioner

## 2021-09-25 VITALS — BP 128/56 | HR 78 | Temp 97.6°F | Resp 18 | Wt 149.7 lb

## 2021-09-25 DIAGNOSIS — R5381 Other malaise: Secondary | ICD-10-CM

## 2021-09-25 DIAGNOSIS — Z515 Encounter for palliative care: Secondary | ICD-10-CM

## 2021-09-25 NOTE — Progress Notes (Signed)
? ? ?[facility] ?Community Palliative Care Consult Note ?Telephone: [phone]  ?Fax: [phone]  ? ? ?Date of encounter: 09/25/21 ?7:46 PM ?PATIENT NAME: Evan Good ?[place] [address]   ?[phone] (home)  ?DOB: 11-23-1970 ?MRN: [id number] ?PRIMARY CARE PROVIDER:    ?[facility] [employer] ? ?RESPONSIBLE PARTY:    ?Contact Information   ? ? Name Relation Home Work Mobile  ? [place], [place]    ? ?  ? ?I met face to face with patient in /facility. Palliative Care was asked to follow this patient by consultation request of  [place] to address advance care planning and complex medical decision making. This is a follow up visit.                                  ?ASSESSMENT AND PLAN / RECOMMENDATIONS:  ?Symptom Management/Plan: ?1. ACP; Full code with full scope of treatment;  ?  ?2. 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  ?3. Palliative care encounter; Palliative care encounter; Palliative medicine team will continue to support patient, patient's family, and medical team. Visit consisted of counseling and education dealing with the complex and emotionally intense issues of symptom management and palliative care in the setting of serious and potentially life-threatening illness ?  ?Follow up Palliative Care Visit: Palliative care will continue to follow for complex medical decision making, advance care planning, and clarification of goals. Return 8 weeks or prn. ? ?I spent 47 minutes providing this consultation. More than 50% of the time in this consultation was spent in counseling and care  coordination. ?PPS: 30% ? ?Chief Complaint: Follow up palliative consult for complex medical decision making ? ?HISTORY OF PRESENT ILLNESS:  Evan Good is a 51 y.o. 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 Mr Evan Good was discharged to [place] at [facility]. Mr. Evan Good requires total care for mobility, transfers as he is hoyer to recliner chair, turning, positioning, bathing, dressing, incontinence bowel and bladder. Fed though tube feedings essential to sustain life. Staff endorses no new changes or concerns. At present Mr. Evan Good is lying in bed, smiling. Mr. Evan Good appears decompensated, debilitated, chronically ill, no distress. No visitors present. I visited and observed Mr. Evan Good. Mr. Evan Good makes eye contact, blinks to questions, nods his head to answer questions. Mr. Evan Good nodded no when asked about symptoms of pain, shortness of breath. Mr. Evan Good was cooperative with assessment. Limited interaction as most pc visit supportive. I called Mrs Evan Good, updated on PC visit, reviewed medical goals, no new changes to goc, updated staff ?  Marland Kitchen  ?History obtained from review of EMR, discussion with facility staff and  Mr. Evan Good. Mrs Evan Good by phone ?I reviewed available labs, medications, imaging, studies and related documents from the EMR.  Records reviewed and summarized above.  ? ?  ROS ?10 point system reviewed with staff with [name] cognitive impairment all negative except HPI ? ?Physical Exam:: ?Constitutional: NAD ?General: frail appearing, debilitated, cognitively impaired male ?EYES:   lids intact ?ENMT: oral mucous membranes moist ?CV: S1S2, RRR, no LE edema ?Pulmonary: decreased throughout, anteriorly, no increased work of breathing, no cough ?Abdomen:  normo-active BS + 4 quadrants, soft and non tender; g-tube ?MSK: bed-bound, functionally quadriplegic ?Skin: warm and dry ?Neuro:  + generalized weakness ?Psych: non-anxious affect, Alert, cognitive impairment ?Thank you for the opportunity to participate in the care of Mr. Evan Good.  The palliative care team will continue to follow. Please call our office at [phone] if we can be of additional assistance.  ? ?Angellee Cohill Z Indiya Izquierdo, NP  ? ? ? ?

## 2021-11-02 ENCOUNTER — Non-Acute Institutional Stay: Payer: Medicare Other | Admitting: Nurse Practitioner

## 2021-11-02 ENCOUNTER — Encounter: Payer: Self-pay | Admitting: Nurse Practitioner

## 2021-11-02 VITALS — BP 104/54 | HR 82 | Temp 97.8°F | Resp 20 | Wt 155.0 lb

## 2021-11-02 DIAGNOSIS — R5381 Other malaise: Secondary | ICD-10-CM

## 2021-11-02 DIAGNOSIS — E440 Moderate protein-calorie malnutrition: Secondary | ICD-10-CM

## 2021-11-02 DIAGNOSIS — Z515 Encounter for palliative care: Secondary | ICD-10-CM

## 2021-11-02 NOTE — Progress Notes (Signed)
? ? ?Manufacturing engineer ?Community Palliative Care Consult Note ?Telephone: [phone]  ?Fax: [phone]  ? ? ?Date of encounter: 11/02/21 ?7:22 PM ?PATIENT NAME: Jerimyah Achilefu [place] ?[place] [address]   ?[phone] (home)  ?DOB: 1971/01/28 ?MRN: [id number] ?PRIMARY CARE PROVIDER:    ?[facility] [employer] ? ?RESPONSIBLE PARTY:    ?Contact Information   ? ? Name Relation Home Work Mobile  ? [place], [place]    ? ?  ? ?I met face to face with patient in facility. Palliative Care was asked to follow this patient by consultation request of  [place] center to address advance care planning and complex medical decision making. This is a follow up visit.                                  ?ASSESSMENT AND PLAN / RECOMMENDATIONS:  ?Symptom Management/Plan: ?1. ACP; Full code with full scope of treatment;  ?  ?2. 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  ?3. Palliative care encounter; Palliative care encounter; Palliative medicine team will continue to support patient, patient's family, and medical team. Visit consisted of counseling and education dealing with the complex and emotionally intense issues of symptom management and palliative care in the setting of serious and potentially life-threatening illness ? ? ?Follow up Palliative Care Visit: Palliative care will continue to follow for complex medical decision making, advance care planning, and clarification of goals. Return 4 weeks or prn. ? ?I spent 32 minutes providing this consultation. More than 50% of the time in this consultation was spent in counseling and care  coordination ? ?PPS: 30% ?Chief Complaint: Follow up palliative consult for complex medical decision making ? ?HISTORY OF PRESENT ILLNESS:  Jettie Achilefu Coppa is a 51 y.o. 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 Mr Cybulski was discharged to [place] at [facility]. Mr. Holsworth requires total care for mobility, transfers as he is hoyer to recliner chair, turning, positioning, bathing, dressing, incontinence bowel and bladder. Fed though tube feedings essential to sustain life. Staff endorses no new changes or concerns. At present Mr. Lor is lying in bed, smiling. Mr. Oregel appears decompensated, debilitated, chronically ill, no distress. No visitors present. I visited and observed Mr. Hansson. Mr. Papadopoulos makes eye contact, blinks to questions, nods his head to answer questions. Mr. Copley cognitively impaired, reviewed with facility staff. Mr. Hair was cooperative with assessment. Limited interaction as most pc visit supportive. I called Mrs Norem, updated on PC visit, message left, reviewed medical goals, no new changes to goc, updated staff ? ?History obtained from review of EMR, discussion with facility staff and Mr. Degidio.  ?I reviewed available labs, medications, imaging, studies and related documents from the EMR.  Records reviewed and summarized above.  ? ?ROS ?10 point system reviewed with facility staff as Mr Monrreal  is cognitively impaired ? ?Physical Exam: ?Constitutional: NAD ?General: frail appearing, chronically ill, debilitated male, bedbound ?EYES: lids intact ?ENMT: oral mucous  membranes moist, ?CV: S1S2, RRR ?Pulmonary: decrease bases, no increased work of breathing, + cough, room air ?Abdomen:  normo-active BS + 4 quadrants, soft and non tender; g-tube ?GU: deferred ?MSK: functional quadriplegic ?Skin: warm and dry, multiple wounds ?Neuro:  + generalized weakness,  + cognitive impairment ?Psych: non-anxious affect, Alert, smiles ?Thank you for the opportunity to participate in the care of Mr. Henes.  The palliative care team will continue to follow. Please call our office at [phone] if we can be of additional assistance.  ? ?Aylin Rhoads Z Latarra Eagleton, NP  ?  ?

## 2021-11-19 ENCOUNTER — Other Ambulatory Visit: Payer: Self-pay

## 2021-11-19 ENCOUNTER — Emergency Department: Payer: Medicare Other

## 2021-11-19 ENCOUNTER — Emergency Department
Admission: EM | Admit: 2021-11-19 | Discharge: 2021-11-19 | Disposition: A | Discharge status: Skilled Nursing Facility | Payer: Medicare Other | Attending: Emergency Medicine | Admitting: Emergency Medicine

## 2021-11-19 DIAGNOSIS — I509 Heart failure, unspecified: Secondary | ICD-10-CM | POA: Diagnosis not present

## 2021-11-19 DIAGNOSIS — Z931 Gastrostomy status: Secondary | ICD-10-CM | POA: Diagnosis present

## 2021-11-19 DIAGNOSIS — E119 Type 2 diabetes mellitus without complications: Secondary | ICD-10-CM | POA: Diagnosis not present

## 2021-11-19 MED ORDER — DIATRIZOATE MEGLUMINE & SODIUM 66-10 % PO SOLN
30.0000 mL | Freq: Once | ORAL | Status: AC
  Administered 2021-11-19: 30 mL

## 2021-11-19 NOTE — ED Provider Notes (Signed)
? ?[facility] ?Provider Note ? ? ? Event Date/Time  ? First MD Initiated Contact with Patient 11/19/21 1719   ?  (approximate) ? ? ?History  ? ?Chief Complaint ?Peg Tube Placement  ? ? ?HPI ? ?Evan Good is a 51 y.o. 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  History is limited as patient is nonverbal at baseline.  He arrives from [facility] and per EMS, he required replacement of his PEG tube at the facility.  They were unable to confirm placement of the PEG tube and so EMS was called to bring patient to the ED for imaging.  Patient is reportedly at his baseline mental status and is nonverbal and bedbound at baseline.  He is calm and comfortable appearing on my assessment. ?  ? ? ?Physical Exam  ? ?Triage Vital Signs: ?ED Triage Vitals  ?Enc Vitals Group  ?   BP 11/19/21 1615 109/71  ?   Pulse Rate 11/19/21 1615 90  ?   Resp 11/19/21 1612 19  ?   Temp 11/19/21 1612 98.4 ?F (36.9 ?C)  ?   Temp Source 11/19/21 1612 Axillary  ?   SpO2 11/19/21 1615 97 %  ?   Weight 11/19/21 1718 154 lb 15.7 oz (70.3 kg)  ?   Height 11/19/21 1610 5\' 11"  (1.803 m)  ?   Head Circumference --   ?   Peak Flow --   ?   Pain Score --   ?   Pain Loc --   ?   Pain Edu? --   ?   Excl. in GC? --   ? ? ?Most recent vital signs: ?Vitals:  ? 11/19/21 1612 11/19/21 1615  ?BP:  109/71  ?Pulse:  90  ?Resp: 19 19  ?Temp: 98.4 ?F (36.9 ?C) 98.4 ?F (36.9 ?C)  ?SpO2:  97%  ? ? ?Constitutional: Awake and alert, comfortable appearing. ?Eyes: Conjunctivae are normal. ?Head: Atraumatic. ?Nose: No congestion/rhinnorhea. ?Mouth/Throat: Mucous membranes are moist.  ?Cardiovascular: Normal rate, regular rhythm. Grossly normal heart sounds.  2+ radial pulses bilaterally. ?Respiratory: Normal respiratory effort.  No retractions. Lungs CTAB. ?Gastrointestinal:  Soft and nontender. No distention.  PEG tube in place with no erythema, warmth, tenderness, or leakage. ?Musculoskeletal: No lower extremity tenderness nor edema.  ?Neurologic: Nonverbal at baseline.. No gross focal neurologic deficits are appreciated. ? ? ? ?ED Results / Procedures / Treatments  ? ?Labs ?(all labs ordered are listed, but only abnormal results are displayed) ?Labs Reviewed - No data to display ? ?RADIOLOGY ?Abdominal x-ray reviewed and interpreted by me with contrast moving into the jejunum, confirming appropriate PEG tube placement. ? ?PROCEDURES: ? ?Critical Care performed: No ? ?Procedures ? ? ?MEDICATIONS ORDERED IN ED: ?Medications  ?diatrizoate meglumine-sodium (GASTROGRAFIN) 66-10 % solution 30 mL (30 mLs Per Tube Given 11/19/21 1711)  ? ? ? ?IMPRESSION / MDM / ASSESSMENT AND PLAN / ED COURSE  ?I reviewed the triage vital signs and the nursing notes. ?             ?               ? ?51 y.o. male with past medical history of anoxic brain injury, Roux-en-Y gastric bypass, CHF, diabetes, hidradenitis, seizures, and PEG tube dependence who presents to the ED for confirmation of PEG tube placement at his  facility. ? ?Differential diagnosis includes, but is not limited to, displaced PEG tube, appropriately positioned PEG tube, PEG tube site infection, contrast extravasation. ? ?Patient nontoxic-appearing and in no acute distress, vital signs are unremarkable and he appears at his baseline mental status.  Externally, PEG tube appears appropriately positioned with no signs of infection or leakage.  Abdominal x-ray with contrast shows appropriate positioning and patient is appropriate for discharge back to his nursing facility. ? ?  ? ? ?FINAL CLINICAL IMPRESSION(S) / ED DIAGNOSES  ? ?Final diagnoses:  ?S/P percutaneous endoscopic gastrostomy (PEG) tube placement (HCC)  ? ? ? ?Rx / DC Orders  ? ?ED Discharge Orders   ? ? None  ? ?  ? ? ? ?Note:  This document was prepared using Dragon voice recognition  software and may include unintentional dictation errors. ?  ?Chesley Noon, MD ?11/19/21 1752 ? ?

## 2021-11-19 NOTE — ED Triage Notes (Signed)
Patient to ER via [facility] from [facility]. Patient non-verbal/bed bound at baseline. Facility were unable to verify his peg tube placement today, patient sent to confirm placement.  ? ?EMS VSS- bp 119/80, Hr 84, RA sats 100%.  ?

## 2021-12-01 ENCOUNTER — Ambulatory Visit: Payer: Medicare Other | Admitting: Gastroenterology

## 2021-12-17 ENCOUNTER — Other Ambulatory Visit: Payer: Self-pay

## 2021-12-17 ENCOUNTER — Emergency Department: Payer: Medicare Other

## 2021-12-17 ENCOUNTER — Encounter: Payer: Self-pay | Admitting: Emergency Medicine

## 2021-12-17 ENCOUNTER — Emergency Department
Admission: EM | Admit: 2021-12-17 | Discharge: 2021-12-17 | Disposition: A | Discharge status: Skilled Nursing Facility | Payer: Medicare Other | Attending: Emergency Medicine | Admitting: Emergency Medicine

## 2021-12-17 DIAGNOSIS — K9423 Gastrostomy malfunction: Secondary | ICD-10-CM | POA: Diagnosis not present

## 2021-12-17 DIAGNOSIS — E119 Type 2 diabetes mellitus without complications: Secondary | ICD-10-CM | POA: Diagnosis not present

## 2021-12-17 MED ORDER — DIATRIZOATE MEGLUMINE & SODIUM 66-10 % PO SOLN
30.0000 mL | Freq: Once | ORAL | Status: AC
  Administered 2021-12-17: 30 mL via NASOGASTRIC

## 2021-12-17 NOTE — ED Provider Notes (Addendum)
[facility] Provider Note    Event Date/Time   First MD Initiated Contact with Patient 12/17/21 1734     (approximate)   History   feeding tube   HPI  Evan Good is a 51 y.o. male with anoxic brain injury status post peg tube placement Roux-en-Y gastric bypass, diabetes, hidradenitis, seizures, and CHF who comes in with concerns for PEG tube displacement.  Patient is nonverbal at baseline he comes in from [facility] due to the PEG tube coming out.  Looks like it is a 57 [place].  They were able to place a Foley to hold the tract.  Unclear exactly when it came out but Foley was placed to hold the tract..  Patient is otherwise at his baseline self.  I reviewed the notes and patient was admitted on 12/16/2020 for cellulitis and was found to have a G-tube at that time  Physical Exam   Triage Vital Signs: ED Triage Vitals  Enc Vitals Group     BP 12/17/21 1742 135/75     Pulse Rate 12/17/21 1742 95     Resp 12/17/21 1742 18     Temp 12/17/21 1742 98 F (36.7 C)     Temp Source 12/17/21 1742 Axillary     SpO2 12/17/21 1742 97 %     Weight 12/17/21 1743 154 lb 15.7 oz (70.3 kg)     Height 12/17/21 1743 5\' 11"  (1.803 m)     Head Circumference --      Peak Flow --      Pain Score 12/17/21 1743 0     Pain Loc --      Pain Edu? --      Excl. in GC? --     Most recent vital signs: Vitals:   12/17/21 1742  BP: 135/75  Pulse: 95  Resp: 18  Temp: 98 F (36.7 C)  SpO2: 97%     General: Awake, no distress.  Patient is nonverbal CV:  Good peripheral perfusion.  Resp:  Normal effort.  Abd:  No distention.  Patient currently has Foley placed into G tube spot without any evidence of infection Other:     ED Results / Procedures / Treatments   Labs (all labs ordered are listed, but only abnormal results are displayed) Labs Reviewed - No data to display    RADIOLOGY I have reviewed the xray personally and interpreted it and  tube appears in place   PROCEDURES:  Critical Care performed: No  Gastrostomy tube replacement  Date/Time: 12/17/2021 6:51 PM  Performed by: 02/16/2022, MD Authorized by: Concha Se, MD  Consent: The procedure was performed in an emergent situation. Time out: Immediately prior to procedure a "time out" was called to verify the correct patient, procedure, equipment, support staff and site/side marked as required. Preparation: Patient was prepped and draped in the usual sterile fashion. Local anesthesia used: no  Anesthesia: Local anesthesia used: no  Sedation: Patient sedated: no  Patient tolerance: patient tolerated the procedure well with no immediate complications      MEDICATIONS ORDERED IN ED: Medications - No data to display   IMPRESSION / MDM / ASSESSMENT AND PLAN / ED COURSE  I reviewed the triage vital signs and the nursing notes.   Patient's presentation is most consistent with acute, uncomplicated illness.   This concerning for G-tube displacement.  Foley catheter in place we will try to get another G-tube that is a 1 26 to  replace it and do a tube check.  No evidence of infection.  Abdomen is soft and nontender.  Unclear how long its been out for but a Foley catheter is now in place holding the track and he has had this G-tube for many months now so should be okay for me to replace here in the emergency room.  Abdomen soft and nontender low suspicion for peritonitis patient afebrile with normal vital signs  We tried to insert a 22 French catheter but unable to pass.  We have called materials and they do not have any 20 [place] G-tube so we will downsized to 75 [place].  I did call patient's spouse and updated her and she expressed understanding.  I did clarify with the radiologist that it was a G-tube that I put in but given patient's prior abdominal surgeries is technically in the J-pouch but this was the same type of tube that was in there previously.   So similar placement to what he had previously    FINAL CLINICAL IMPRESSION(S) / ED DIAGNOSES   Final diagnoses:  Gastrostomy tube dysfunction (HCC)     Rx / DC Orders   ED Discharge Orders     None        Note:  This document was prepared using Dragon voice recognition software and may include unintentional dictation errors.   Concha Se, MD 12/17/21 1854    Concha Se, MD 12/17/21 2021

## 2021-12-17 NOTE — ED Triage Notes (Signed)
Presents via EMS from [facility]  for g-tube placement

## 2021-12-17 NOTE — ED Notes (Signed)
Pt waiting for EMS transport back to nursing home

## 2021-12-17 NOTE — Discharge Instructions (Addendum)
We had to put in a 18 Jamaica G-tube instead of the 20 [place] due to not having a 20 [place] if there are any issues with this you could call the team who put it in to see if they can put a new 20 [place] in. With this happens and you have the 20 French tubes please send him with a tube.  Thank you for putting a Foley and hold the spot open in the meantime.  Return for fevers, abdominal pain or any other concerns

## 2022-01-04 ENCOUNTER — Ambulatory Visit (INDEPENDENT_AMBULATORY_CARE_PROVIDER_SITE_OTHER): Payer: Medicare Other | Admitting: Dermatology

## 2022-01-04 DIAGNOSIS — L97929 Non-pressure chronic ulcer of unspecified part of left lower leg with unspecified severity: Secondary | ICD-10-CM | POA: Diagnosis not present

## 2022-01-04 DIAGNOSIS — L89309 Pressure ulcer of unspecified buttock, unspecified stage: Secondary | ICD-10-CM | POA: Diagnosis not present

## 2022-01-04 DIAGNOSIS — L732 Hidradenitis suppurativa: Secondary | ICD-10-CM | POA: Diagnosis not present

## 2022-01-15 ENCOUNTER — Ambulatory Visit: Payer: Medicare Other | Admitting: Physician Assistant

## 2022-01-29 ENCOUNTER — Non-Acute Institutional Stay: Payer: Medicare Other | Admitting: Nurse Practitioner

## 2022-01-29 VITALS — BP 133/78 | HR 82 | Temp 97.5°F | Resp 18 | Wt 156.0 lb

## 2022-01-29 DIAGNOSIS — Z515 Encounter for palliative care: Secondary | ICD-10-CM

## 2022-01-29 DIAGNOSIS — R5381 Other malaise: Secondary | ICD-10-CM

## 2022-01-29 DIAGNOSIS — E440 Moderate protein-calorie malnutrition: Secondary | ICD-10-CM

## 2022-01-30 ENCOUNTER — Encounter: Payer: Self-pay | Admitting: Nurse Practitioner

## 2022-01-30 NOTE — Progress Notes (Signed)
[place] Consult Note Telephone: [phone]  Fax: [phone]    Date of encounter: 01/30/22 10:07 AM PATIENT NAME: Evan Good [address] [place] [address]   [phone] (home)  DOB: June 05, 1971 MRN: [id number] PRIMARY CARE PROVIDER:    [facility]  RESPONSIBLE PARTY:    Contact Information     Name Relation Home Work Mobile   [facility] Spouse [phone]            I met face to face with patient in facility. Palliative Care was asked to follow this patient by consultation request of  Evan Good to address advance care planning and complex medical decision making. This is a follow up visit.                                  ASSESSMENT AND PLAN / RECOMMENDATIONS:  Symptom Management/Plan: 1. ACP; Full code with full scope of treatment; ongoing discussions with wife   2. 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 Continue tube feedings, essential to sustain life. 10/14/2021 weight 155 lbs 11/11/2021 weight 155.2 lbs 12/16/2021 weight 154.1 lbs 01/15/2022 weight 156 lbs BMI 21.8 4. Palliative care encounter; Palliative care encounter; Palliative medicine team will continue to support patient, patient's family, and medical team. Visit consisted of counseling and education dealing with the complex and emotionally intense issues of symptom management and palliative care in the  setting of serious and potentially life-threatening illness Follow up Palliative Care Visit: Palliative care will continue to follow for complex medical decision making, advance care planning, and clarification of goals. Return 4 weeks or prn. I spent 47 minutes providing this consultation starting at 12:00pm. More than 50% of the time in this consultation was spent in counseling and care coordination PPS: 30% Chief Complaint: Follow up palliative consult for complex medical decision making HISTORY OF PRESENT ILLNESS:  Evan Good is a 51 y.o. 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 Evan Good was discharged to [place] at [facility]. Evan. Good requires total care for mobility, transfers as he is hoyer to recliner chair, turning, positioning, bathing, dressing, incontinence bowel and bladder. Fed though tube feedings essential to sustain life. Staff endorses no new changes or concerns. ED visit on 11/19/2021 for G-tube replacement; ED visit on 12/17/2021 for G-tube replacement. At present Evan. Good is lying in bed, smiling. Evan. Good appears decompensated, debilitated, chronically ill, no distress. No visitors present. I visited and observed Evan. Good. He was sleeping, awoke to verbal cues. Evan. Good makes eye contact, blinks to questions, nods his head to answer questions. Evan. Good cognitively impaired, no meaningful discussion. Evan. Good was cooperative with assessment. Limited  interaction as most pc visit supportive. I called Evan Good, updated on PC visit, reviewed medical goals, no  new changes to goc, supportive discussion, I updated staff   History obtained from review of EMR, discussion with facility staff and Evan. Good.  I reviewed available labs, medications, imaging, studies and related documents from the EMR.  Records reviewed and summarized above.    ROS 10 point system reviewed with facility staff as Evan Good is cognitively impaired   Physical Exam: Constitutional: NAD General: frail appearing, chronically ill, debilitated male, bedbound EYES: lids intact ENMT: oral mucous membranes moist, CV: S1S2, RRR Pulmonary: decrease bases, no increased work of breathing, + cough, room air Abdomen:  normo-active BS + 4 quadrants, soft and non tender; g-tube GU: deferred MSK: functional quadriplegic Skin: warm and dry, multiple wounds Neuro:  + generalized weakness,  + cognitive impairment Psych: non-anxious affect, Alert, smiles  Thank you for the opportunity to participate in the care of Evan. Good.  The palliative care team will continue to follow. Please call our office at [phone] if we can be of additional assistance.   Evan Good Evan Gully, NP

## 2022-04-30 ENCOUNTER — Encounter: Payer: Self-pay | Admitting: Nurse Practitioner

## 2022-04-30 ENCOUNTER — Non-Acute Institutional Stay: Payer: Medicare Other | Admitting: Nurse Practitioner

## 2022-04-30 VITALS — BP 134/80 | HR 94 | Temp 98.1°F | Resp 18 | Wt 157.1 lb

## 2022-04-30 DIAGNOSIS — R5381 Other malaise: Secondary | ICD-10-CM

## 2022-04-30 DIAGNOSIS — Z515 Encounter for palliative care: Secondary | ICD-10-CM

## 2022-04-30 DIAGNOSIS — E440 Moderate protein-calorie malnutrition: Secondary | ICD-10-CM

## 2022-04-30 NOTE — Progress Notes (Signed)
[facility] Consult Note Telephone: [phone]  Fax: [phone]    Date of encounter: 04/30/22 11:44 AM PATIENT NAME: Evan Good 51 [address] [place] [address]   [phone] (home)  DOB: Dec 29, 1970 MRN: [id number] PRIMARY CARE PROVIDER:    [facility]  RESPONSIBLE PARTY:    Contact Information     Name Relation Home Work Mobile   [facility] Spouse [phone]         I met face to face with patient in facility. Palliative Care was asked to follow this patient by consultation request of  [place] [facility] to address advance care planning and complex medical decision making. This is a follow up visit.                                  ASSESSMENT AND PLAN / RECOMMENDATIONS:  Symptom Management/Plan: 1. ACP; Full code with full scope of treatment; ongoing discussions with wife   2. 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 Continue tube feedings, essential to sustain life. 10/14/2021 weight 155 lbs 11/11/2021 weight 155.2 lbs 12/16/2021 weight 154.1 lbs 01/15/2022 weight 156 lbs 04/14/2022 weight 157.1 lbs  4. Palliative care encounter; Palliative care encounter; Palliative medicine team will continue to support patient, patient's family, and medical team. Visit consisted of counseling and education dealing with the complex and emotionally intense issues of symptom management and palliative  care in the setting of serious and potentially life-threatening illness Follow up Palliative Care Visit: Palliative care will continue to follow for complex medical decision making, advance care planning, and clarification of goals. Return 4 weeks or prn. I spent 45  minutes providing this consultation starting at 9:30 am. More than 50% of the time in this consultation was spent in counseling and care coordination PPS: 30% Chief Complaint: Follow up palliative consult for complex medical decision making HISTORY OF PRESENT ILLNESS:  Lang Achilefu Dues is a 51 y.o. 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 Mr Neyhart was discharged to [place] at [facility]. Mr. Kappes requires total care for mobility, transfers as he is hoyer to recliner chair, turning, positioning, bathing, dressing, incontinence bowel and bladder. Fed though tube feedings essential to sustain life. Staff endorses no new changes or concerns. ED visit on 11/19/2021 for G-tube replacement; ED visit on 12/17/2021 for G-tube replacement. At present Mr. Wich is lying in bed, smiling. Mr. Mathews appears decompensated, debilitated, chronically ill, no distress. Mr Rabel was yelling, making noises at provider, smiling, appeared very excited to communicate. Difficulty with understanding Mr. Mcgaha, with cognitive and language impairment, cooperative with assessment. Support provided. I attempted to call Ms. Cocking, message left with contact information, medical  goals reviewed. Updated staff.    History obtained from review of EMR, discussion  with facility staff and Mr. Ganaway.  I reviewed available labs, medications, imaging, studies and related documents from the EMR.  Records reviewed and summarized above.    ROS 10 point system reviewed with facility staff as Mr Bellanca is cognitively impaired   Physical Exam: Constitutional: NAD General: frail appearing, chronically ill, debilitated male, bedbound EYES: lids intact ENMT: oral mucous membranes moist, CV: S1S2, RRR Pulmonary: decrease bases, no increased work of breathing, + cough, room air Abdomen:  normo-active BS + 4 quadrants, soft and non tender; g-tube GU: deferred MSK: functional quadriplegic Skin: warm and dry, multiple wounds Neuro:  + generalized weakness,  + cognitive impairment Psych: non-anxious affect, Alert, smiles  Thank you for the opportunity to participate in the care of Mr. Oshields.  The palliative care team will continue to follow. Please call our office at [phone] if we can be of additional assistance.   Weslyn Holsonback Ihor Gully, NP

## 2022-07-16 ENCOUNTER — Non-Acute Institutional Stay: Payer: Medicare Other | Admitting: Nurse Practitioner

## 2022-07-16 VITALS — BP 117/77 | HR 70 | Temp 97.6°F | Resp 18 | Wt 163.7 lb

## 2022-07-16 DIAGNOSIS — R5381 Other malaise: Secondary | ICD-10-CM

## 2022-07-16 DIAGNOSIS — Z515 Encounter for palliative care: Secondary | ICD-10-CM

## 2022-07-16 DIAGNOSIS — E440 Moderate protein-calorie malnutrition: Secondary | ICD-10-CM

## 2022-07-17 ENCOUNTER — Encounter: Payer: Self-pay | Admitting: Nurse Practitioner

## 2022-07-17 NOTE — Progress Notes (Signed)
[facility] Consult Note Telephone: [phone]  Fax: [phone]    Date of encounter: 07/17/22 2:00 PM PATIENT NAME: Georgi Navarrete [address] [place] [address]   [phone] (home)  DOB: 1971-01-09 MRN: [id number] PRIMARY CARE PROVIDER:    [facility]  RESPONSIBLE PARTY:    Contact Information     Name Relation Home Work Mobile   [facility] Spouse [phone]       I met face to face with patient in facility. Palliative Care was asked to follow this patient by consultation request of  [place] [facility] to address advance care planning and complex medical decision making. This is a follow up visit.                                  ASSESSMENT AND PLAN / RECOMMENDATIONS:  Symptom Management/Plan: 1. ACP; Full code with full scope of treatment; ongoing discussions with wife   2. 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 Continue tube feedings, essential to sustain life. 10/14/2021 weight 155 lbs 11/11/2021 weight 155.2 lbs 12/16/2021 weight 154.1 lbs 01/15/2022 weight 156 lbs 04/14/2022 weight 157.1 lbs 07/14/2022 weight 163.5 lbs   4. Palliative care encounter; Palliative care encounter; Palliative medicine team will continue to support patient, patient's family, and medical team. Visit consisted of counseling and education dealing with the complex and emotionally  intense issues of symptom management and palliative care in the setting of serious and potentially life-threatening illness Follow up Palliative Care Visit: Palliative care will continue to follow for complex medical decision making, advance care planning, and clarification of goals. Return 4 to 8 weeks or prn. I spent 46  minutes providing this consultation. More than 50% of the time in this consultation was spent in counseling and care coordination PPS: 30% Chief Complaint: Follow up palliative consult for complex medical decision making HISTORY OF PRESENT ILLNESS:  Story Achilefu Seal is a 52 y.o. 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 Mr Vanderveen was discharged to [place] at [facility]. Mr. Vieth requires total care for mobility, transfers as he is hoyer to recliner chair, turning, positioning, bathing, dressing, incontinence bowel and bladder. Fed though tube feedings essential to sustain life. Staff endorses no new changes or concerns. ED visit on 11/19/2021 for G-tube replacement; ED visit on 12/17/2021 for G-tube replacement. At present Mr. Scarlata is lying in bed, makes eye contact, making facial gestures but no verbal responses. Mr Giraud appears comfortable, no meaningful discussion, cooperative with assessment, Support provided. Medical goals, poc, medications, orders reviewed. No recommended changes. Reviewed weights, continues TF. I attempted to call Ms. Radich, message left with contact information, medical goals  reviewed. Updated staff.    History obtained from review of EMR, discussion  with facility staff and Mr. Reinders.  I reviewed available labs, medications, imaging, studies and related documents from the EMR.  Records reviewed and summarized above.  Physical Exam: Constitutional: NAD General: custodial care, debilitated male, bedbound EYES: lids intact ENMT: oral mucous membranes moist CV: S1S2, RRR Pulmonary: decrease bases Abdomen:  normo-active BS + 4 quadrants, soft and non tender; g-tube MSK: functional quadriplegic Skin: warm and dry, multiple wounds Neuro:  + generalized weakness,  + cognitive impairment Psych: non-anxious affect, Alert Thank you for the opportunity to participate in the care of Mr. Cotham. Please call our office at [phone] if we can be of additional assistance.   Drey Shaff Prince Rome, NP

## 2022-07-19 ENCOUNTER — Other Ambulatory Visit: Payer: Self-pay

## 2022-07-19 ENCOUNTER — Emergency Department
Admission: EM | Admit: 2022-07-19 | Discharge: 2022-07-19 | Disposition: A | Discharge status: Skilled Nursing Facility | Payer: Medicare Other | Attending: Emergency Medicine | Admitting: Emergency Medicine

## 2022-07-19 ENCOUNTER — Emergency Department: Payer: Medicare Other

## 2022-07-19 DIAGNOSIS — J449 Chronic obstructive pulmonary disease, unspecified: Secondary | ICD-10-CM | POA: Diagnosis not present

## 2022-07-19 DIAGNOSIS — I509 Heart failure, unspecified: Secondary | ICD-10-CM | POA: Diagnosis not present

## 2022-07-19 DIAGNOSIS — K9423 Gastrostomy malfunction: Secondary | ICD-10-CM | POA: Insufficient documentation

## 2022-07-19 DIAGNOSIS — E119 Type 2 diabetes mellitus without complications: Secondary | ICD-10-CM | POA: Diagnosis not present

## 2022-07-19 MED ORDER — DIATRIZOATE MEGLUMINE & SODIUM 66-10 % PO SOLN
30.0000 mL | Freq: Once | ORAL | Status: AC
  Administered 2022-07-19: 30 mL

## 2022-07-19 NOTE — ED Notes (Signed)
Called [facility] for transport to [place]

## 2022-07-19 NOTE — ED Notes (Signed)
Repositioned patient in bed, elevated heels on pillow.   VSS at this time. Awaiting transport back to facility.

## 2022-07-19 NOTE — ED Notes (Signed)
Discharge instructions given to Janett Billow, Therapist, sports at [facility].

## 2022-07-19 NOTE — ED Notes (Signed)
Dr. Starleen Blue replaced g-tube with 39french.

## 2022-07-19 NOTE — ED Provider Notes (Signed)
[facility] Provider Note    Event Date/Time   First MD Initiated Contact with Patient 07/19/22 [phone]     (approximate)   History   Tube Replacement   HPI  Evan Good is a 52 y.o. male past medical history of multiple medical problems including gastric bypass, hypoxic encephalopathy secondary to presumed hypoglycemia, nutritional deficiencies status post PEG tube placement, seizures CHF diabetes COPD who presents because PEG tube was displaced.  I was not present for EMS report and there is no documented time of PEG tube dislodgment per triage report.  Patient is nonverbal at baseline he is smiling.  Does not appear to be in distress.  There is an 63 French PEG tube with the balloon fully inflated at his bedside.     Past Medical History:  Diagnosis Date   COPD (chronic obstructive pulmonary disease) (HCC)    Diabetes mellitus without complication (HCC)    Seizures (HCC)     Patient Active Problem List   Diagnosis Date Noted   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   Cellulitis 12/16/2020   Cutaneous abscesses of both axillae 12/16/2020   History of anoxic brain injury 12/16/2020   Malnutrition of moderate degree 10/28/2020   Sepsis (HCC) 10/27/2020   Sepsis associated hypotension (HCC) 10/26/2020   Leukocytosis 10/26/2020   Cystitis 10/26/2020   Total self-care deficit 10/26/2020   Feeding tube blocked 10/26/2020   Pressure injury of skin 10/26/2020   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) 10/26/2020     Physical Exam  Triage Vital Signs: ED Triage Vitals [07/19/22 0739]  Enc Vitals Group     BP 111/70     Pulse Rate 91     Resp 16     Temp 98.6 F (37 C)     Temp Source Axillary     SpO2 98 %     Weight 163 lb 12.8 oz (74.3 kg)     Height 5\' 11"  (1.803 m)     Head Circumference      Peak Flow      Pain Score 0     Pain Loc      Pain Edu?      Excl. in GC?     Most recent vital signs: Vitals:   07/19/22  0739  BP: 111/70  Pulse: 91  Resp: 16  Temp: 98.6 F (37 C)  SpO2: 98%     General: Awake, no distress.  CV:  Good peripheral perfusion.  Resp:  Normal effort.  Abd:  No distention.  Soft nontender, G-tube site looks to be almost closed over Neuro:             Awake, Alert, patient is severely contracted, nonverbal but is smiling at me Other:    ED Results / Procedures / Treatments  Labs (all labs ordered are listed, but only abnormal results are displayed) Labs Reviewed - No data to display   EKG     RADIOLOGY I reviewed and interpreted the abdominal x-ray which shows appropriate location of contrast   PROCEDURES:  Critical Care performed: No  Gastrostomy tube replacement  Date/Time: 07/19/2022 8:24 AM  Performed by: 09/17/2022, MD Authorized by: Georga Hacking, MD  Consent: Verbal consent not obtained. Written consent not obtained. Patient identity confirmed: arm band Preparation: Patient was prepped and draped in the usual sterile fashion. Local anesthesia used: no  Anesthesia: Local anesthesia used: no  Sedation: Patient sedated: no  Patient tolerance: patient  tolerated the procedure well with no immediate complications Comments: Attempted 21 French replacement but this was not possible, tube was dilated with 8 [place] Foley and then I was subsequently able to pass a 52 [place] Foley this was the largest catheter that could be placed tube was inflated with 5 cc, no complications     The patient is on the cardiac monitor to evaluate for evidence of arrhythmia and/or significant heart rate changes.   MEDICATIONS ORDERED IN ED: Medications  diatrizoate meglumine-sodium (GASTROGRAFIN) 66-10 % solution 30 mL (30 mLs Per Tube Given 07/19/22 0911)     IMPRESSION / MDM / ASSESSMENT AND PLAN / ED COURSE  I reviewed the triage vital signs and the nursing notes.                              Patient's presentation is most consistent with acute,  uncomplicated illness.  Differential diagnosis includes, but is not limited to, PEG tube displacement  The patient is a 52 year old male who is PEG tube dependent presents because his G-tube was dislodged.  Unclear what the timing of this was but he has an 48 French tube at bedside with intact balloon and his G-tube site looks to be significantly closed over so I suspect that this has been out for some time.  I initially attempted to place an 66 French tube but had limited success due to tract being almost closed.  I then took an 8 [place] catheter used for neonatal catheterization and confirmed that it is indeed patent was then able to pass a 14 French PEG tube with minimal resistance.  There is no surrounding cellulitis on the abdomen abdomen soft nontender.  Patient's vital signs are reassuring appears to be at his baseline.  Will check x-ray with contrast to confirm tube location.  He will otherwise be appropriate for discharge.       FINAL CLINICAL IMPRESSION(S) / ED DIAGNOSES   Final diagnoses:  PEG tube malfunction ([place])     Rx / DC Orders   ED Discharge Orders     None        Note:  This document was prepared using Dragon voice recognition software and may include unintentional dictation errors.   Rada Hay, MD 07/19/22 [phone]

## 2022-07-19 NOTE — ED Triage Notes (Signed)
Pt here via [facility] from [facility] after his G tube became dislodged. Pt non verbal but screams whenever touched. Pt has bilateral; under arm wounds on observation and is severely contracted.

## 2022-07-19 NOTE — ED Notes (Signed)
MD at bedside for G-tube reinsertion.

## 2022-07-19 NOTE — Discharge Instructions (Signed)
We replaced the PEG tube with a 14 French tube, as his tract had closed some.

## 2022-08-17 ENCOUNTER — Non-Acute Institutional Stay: Payer: Medicare Other | Admitting: Nurse Practitioner

## 2022-08-17 ENCOUNTER — Encounter: Payer: Self-pay | Admitting: Nurse Practitioner

## 2022-08-17 VITALS — BP 120/68 | HR 80 | Temp 97.8°F | Resp 18 | Wt 160.0 lb

## 2022-08-17 DIAGNOSIS — Z8669 Personal history of other diseases of the nervous system and sense organs: Secondary | ICD-10-CM

## 2022-08-17 DIAGNOSIS — Z515 Encounter for palliative care: Secondary | ICD-10-CM

## 2022-08-17 DIAGNOSIS — E440 Moderate protein-calorie malnutrition: Secondary | ICD-10-CM

## 2022-08-17 DIAGNOSIS — R5381 Other malaise: Secondary | ICD-10-CM

## 2022-08-17 NOTE — Progress Notes (Signed)
[facility] Consult Note Telephone: [phone]  Fax: [phone]    Date of encounter: 08/17/22 11:37 AM PATIENT NAME: Evan Good [address] [place] [address]   [phone] (home)  DOB: 12/08/1970 MRN: [id number] PRIMARY CARE PROVIDER:    [facility]  RESPONSIBLE PARTY:    Contact Information     Name Relation Home Work Mobile   [facility] Spouse [phone]           I met face to face with patient in facility. Palliative Care was asked to follow this patient by consultation request of  [place] center to address advance care planning and complex medical decision making. This is a follow up visit.                                  ASSESSMENT AND PLAN / RECOMMENDATIONS:  Symptom Management/Plan: 1. ACP; Full code with full scope of treatment; ongoing discussions with wife   2. 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 Continue tube feedings, essential to sustain life. 10/14/2021 weight 155 lbs 11/11/2021 weight 155.2 lbs 12/16/2021 weight 154.1 lbs 01/15/2022 weight 156 lbs 04/14/2022 weight 157.1 lbs 07/14/2022 weight 163.5 lbs   08/09/2022 weight 160 lbs 4. Palliative care encounter; Palliative care encounter; Palliative medicine team will continue to support patient, patient's family, and medical team. Visit consisted of counseling and education dealing  with the complex and emotionally intense issues of symptom management and palliative care in the setting of serious and potentially life-threatening illness Follow up Palliative Care Visit: Palliative care will continue to follow for complex medical decision making, advance care planning, and clarification of goals. Return 4 to 8 weeks or prn. I spent 37 minutes providing this consultation started 10:45 am. More than 50% of the time in this consultation was spent in counseling and care coordination PPS: 30% Chief Complaint: Follow up palliative consult for complex medical decision making HISTORY OF PRESENT ILLNESS:  Evan Good is a 52 y.o. 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 [name] was discharged to [place] at [facility]. [name] requires total care for mobility, transfers as he is hoyer to recliner chair, turning, positioning, bathing, dressing, incontinence bowel and bladder. Fed though tube feedings essential to sustain life. Purpose of today PC f/u visit further discussion monitor trends of appetite, weights, monitor for functional, cognitive decline with chronic disease progression, assess any active symptoms, supportive role. At present [name] is lying in bed, appears comfortable, makes eye contact, no verbal sounds. [name] appeared very excited with facial expressions, cooperative with assessment. No meaningful discussion with cognitive impairment. Medical goals, medications, poc reviewed. Attempted  to contact wife, updated staff, no further changes to poc today  recommended, stable.   PC f/u visit further discussion monitor trends of appetite, weights, monitor for functional, cognitive decline with chronic disease progression, assess any active symptoms, supportive role.   History obtained from review of EMR, discussion with facility staff and Mr. Boger.  I reviewed available labs, medications, imaging, studies and related documents from the EMR.  Records reviewed and summarized above.  Physical Exam: Constitutional: NAD General: custodial care, debilitated male, bedbound EYES: lids intact ENMT: oral mucous membranes moist CV: S1S2, RRR Pulmonary: decrease bases Abdomen:  normo-active BS + 4 quadrants, soft and non tender; g-tube MSK: functional quadriplegic Skin: warm and dry, multiple wounds Neuro:  + generalized weakness,  + cognitive impairment Psych: non-anxious affect, Alert Thank you for the opportunity to participate in the care of [name]. Please call our office at [phone] if we can be of additional assistance.   Deyja Sochacki Ihor Gully, NP

## 2022-09-22 ENCOUNTER — Non-Acute Institutional Stay: Payer: Medicare Other | Admitting: Nurse Practitioner

## 2022-09-22 ENCOUNTER — Encounter: Payer: Self-pay | Admitting: Nurse Practitioner

## 2022-09-22 VITALS — BP 120/68 | HR 80 | Temp 97.8°F | Resp 18 | Wt 165.7 lb

## 2022-09-22 DIAGNOSIS — E440 Moderate protein-calorie malnutrition: Secondary | ICD-10-CM

## 2022-09-22 DIAGNOSIS — Z8669 Personal history of other diseases of the nervous system and sense organs: Secondary | ICD-10-CM

## 2022-09-22 DIAGNOSIS — Z515 Encounter for palliative care: Secondary | ICD-10-CM

## 2022-09-22 DIAGNOSIS — R5381 Other malaise: Secondary | ICD-10-CM

## 2022-09-22 NOTE — Progress Notes (Signed)
[facility] Consult Note Telephone: [phone]  Fax: [phone]    Date of encounter: 09/22/22 7:06 PM PATIENT NAME: Evan Good [address] [place] [address]   [phone] (home)  DOB: Nov 30, 1970 MRN: [id number] PRIMARY CARE PROVIDER:    [facility]  RESPONSIBLE PARTY:    Contact Information     Name Relation Home Work Mobile   [facility] Spouse [phone]       I met face to face with patient in facility. Palliative Care was asked to follow this patient by consultation request of  [facility] [facility] to address advance care planning and complex medical decision making. This is a follow up visit.                                  ASSESSMENT AND PLAN / RECOMMENDATIONS:  Symptom Management/Plan: 1. ACP; Full code with full scope of treatment; ongoing discussions with wife   2. 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 Continue tube feedings, essential to sustain life.  07/14/2022 weight 163.5 lbs   08/09/2022 weight 160 lbs 09/13/2022 weight 165.7 lbs 4. Palliative care encounter; Palliative care encounter; Palliative medicine team will continue to support patient, patient's family, and medical team. Visit consisted of counseling and education dealing with the complex and emotionally intense issues of symptom management and palliative care in the setting of  serious and potentially life-threatening illness Follow up Palliative Care Visit: Palliative care will continue to follow for complex medical decision making, advance care planning, and clarification of goals. Return 4 to 8 weeks or prn. I spent 37 minutes providing this consultation started 10:45 am. More than 50% of the time in this consultation was spent in counseling and care coordination PPS: 30% Chief Complaint: Follow up palliative consult for complex medical decision making HISTORY OF PRESENT ILLNESS:  Evan Good is a 52 y.o. 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 Evan Good was discharged to [place] at [facility]. Evan. Good requires total care for mobility, transfers as he is hoyer to recliner chair, turning, positioning, bathing, dressing, incontinence bowel and bladder. Fed though tube feedings essential to sustain life. Purpose of today PC f/u visit further discussion monitor trends of appetite, weights, monitor for functional, cognitive decline with chronic disease progression, assess any active symptoms, supportive role.   At present Evan Good is lying in bed, appears comfortable,    PC f/u visit further discussion monitor trends of appetite, weights, monitor for functional, cognitive decline with chronic disease progression, assess any active symptoms, supportive role.   History obtained from review of EMR, discussion with facility staff and Evan. Good.  I reviewed  available labs, medications, imaging, studies and related documents from the EMR.  Records reviewed and  summarized above.  Physical Exam: General: custodial care, debilitated male, bedbound EYES: lids intact ENMT: oral mucous membranes moist CV: S1S2, RRR Pulmonary: clear breath sounds Abdomen:  normo-active BS + 4 quadrants, soft and non tender; g-tube MSK: functional quadriplegic Neuro:  + generalized weakness,  + cognitive impairment Psych: non-anxious affect, Alert Thank you for the opportunity to participate in the care of Evan. Good. Please call our office at [phone] if we can be of additional assistance.   Barbette Mcglaun Ihor Gully, NP

## 2022-10-29 ENCOUNTER — Non-Acute Institutional Stay: Payer: Medicare Other | Admitting: Nurse Practitioner

## 2022-10-29 ENCOUNTER — Encounter: Payer: Self-pay | Admitting: Nurse Practitioner

## 2022-10-29 VITALS — BP 90/63 | HR 82 | Temp 98.0°F | Resp 20 | Wt 167.0 lb

## 2022-10-29 DIAGNOSIS — R5381 Other malaise: Secondary | ICD-10-CM

## 2022-10-29 DIAGNOSIS — E440 Moderate protein-calorie malnutrition: Secondary | ICD-10-CM

## 2022-10-29 DIAGNOSIS — Z515 Encounter for palliative care: Secondary | ICD-10-CM

## 2022-10-29 DIAGNOSIS — Z8669 Personal history of other diseases of the nervous system and sense organs: Secondary | ICD-10-CM

## 2022-10-29 NOTE — Progress Notes (Signed)
Therapist, nutritional Palliative Care Consult Note Telephone: [phone]  Fax: [phone]    Date of encounter: 10/29/22 5:29 PM PATIENT NAME: Evan Good [address] [place] [address]   [phone] (home)  DOB: April 11, 1971 MRN: [id number] PRIMARY CARE PROVIDER:    [facility]  RESPONSIBLE PARTY:    Contact Information     Name Relation Home Work Mobile   [facility] Spouse [phone]          I met face to face with patient in facility. Palliative Care was asked to follow this patient by consultation request of  [facility] to address advance care planning and complex medical decision making. This is a follow up visit.                                  ASSESSMENT AND PLAN / RECOMMENDATIONS:  Symptom Management/Plan: 1. ACP; Full code with full scope of treatment; ongoing discussions with wife   2. 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 Continue tube feedings, essential to sustain life.   07/14/2022 weight 163.5 lbs   08/09/2022 weight 160 lbs 09/13/2022 weight 165.7 lbs 10/25/2022 weight 167 lbs 4. Palliative care encounter; Palliative care encounter; Palliative medicine team will continue to support patient, patient's family, and medical team. Visit consisted of counseling and education dealing with the complex and emotionally intense issues of symptom management and  palliative care in the setting of serious and potentially life-threatening illness Follow up Palliative Care Visit: Palliative care will continue to follow for complex medical decision making, advance care planning, and clarification of goals. Return 4 to 8 weeks or prn. I spent 46 minutes providing this consultation. More than 50% of the time in this consultation was spent in counseling and care coordination PPS: 30% Chief Complaint: Follow up palliative consult for complex medical decision making HISTORY OF PRESENT ILLNESS:  Evan Good is a 52 y.o. 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 Evan Good was discharged to [facility] at [facility]. Evan. Good requires total care for mobility, transfers as he is hoyer to recliner chair, turning, positioning, bathing, dressing, incontinence bowel and bladder. Fed though tube feedings essential to sustain life. Purpose of today PC f/u visit further discussion monitor trends of appetite, weights, monitor for functional, cognitive decline with chronic disease progression, assess any active symptoms, supportive role.  Staff endorses no recent falls, hospitalizations, infections. At present Evan Good is lying in bed, appears comfortable. No visitors present. Evan Good has music playing, bouncing in the bed, making eye contact, smiling, making faces. Evan Good did saw a few words, limited though. Evan Good was cooperative with assessment, weights stable.  Medications, ros, goc reviewed, attempted to contact wife. No new changes today recommended. Cognition  limited with cognitive impairment. PC f/u visit further discussion monitor trends of appetite, weights, monitor for functional, cognitive decline with chronic disease progression, assess any active symptoms, supportive role.   History obtained from review of EMR, discussion with facility staff and Evan. Shimabukuro.  I reviewed available labs, medications, imaging, studies and related documents from the EMR.  Records reviewed and summarized above.  Physical Exam: General: custodial care, debilitated male, bedbound ENMT: oral mucous membranes moist CV: S1S2, RRR Pulmonary: clear breath sounds Abdomen:  normo-active BS + 4 quadrants, soft and non tender; g-tube MSK: functional quadriplegic Neuro:  + generalized weakness,  + cognitive impairment Psych: non-anxious affect, Alert Thank you for the opportunity to participate in the care of Evan. Wieber. Please call our office at [phone] if we can be of additional assistance.   Jai Steil Prince Rome, NP

## 2022-11-02 ENCOUNTER — Other Ambulatory Visit: Payer: Self-pay

## 2022-11-02 ENCOUNTER — Emergency Department
Admission: EM | Admit: 2022-11-02 | Discharge: 2022-11-02 | Disposition: A | Discharge status: Skilled Nursing Facility | Payer: Medicare Other | Attending: Emergency Medicine | Admitting: Emergency Medicine

## 2022-11-02 DIAGNOSIS — E119 Type 2 diabetes mellitus without complications: Secondary | ICD-10-CM | POA: Diagnosis not present

## 2022-11-02 DIAGNOSIS — Z431 Encounter for attention to gastrostomy: Secondary | ICD-10-CM | POA: Insufficient documentation

## 2022-11-02 DIAGNOSIS — Y732 Prosthetic and other implants, materials and accessory gastroenterology and urology devices associated with adverse incidents: Secondary | ICD-10-CM | POA: Insufficient documentation

## 2022-11-02 DIAGNOSIS — T85528A Displacement of other gastrointestinal prosthetic devices, implants and grafts, initial encounter: Secondary | ICD-10-CM | POA: Diagnosis present

## 2022-11-02 DIAGNOSIS — J449 Chronic obstructive pulmonary disease, unspecified: Secondary | ICD-10-CM | POA: Insufficient documentation

## 2022-11-02 NOTE — ED Triage Notes (Signed)
Pt comes via EMs from [facility] with c/o displaced g tube. Pt is at baseline and no other issues.

## 2022-11-02 NOTE — ED Notes (Signed)
Attempted to call pt's legal guardian, unsuccessful attempt.

## 2022-11-02 NOTE — ED Triage Notes (Signed)
First RN note  Pt BIB [facility] from [facility]. Pt has a displaced g-tube this AM and replaced it with a foley catheter. Per facility pt did not pull it out. Per Facility pt is at baseline  EMS vitals: 127/78 96% RA 87 HR 137 cbg

## 2022-11-02 NOTE — ED Provider Notes (Signed)
   [facility] Provider Note    Event Date/Time   First MD Initiated Contact with Patient 11/02/22 1125     (approximate)  History   Chief Complaint: g tube displaced  HPI  Evan Good is a 52 y.o. male past medical history of COPD, diabetes, seizure disorder, presents to the emergency department for dislodged G-tube.  According to EMS patient's G-tube dislodged at some point overnight.  The nursing facility placed a Foley catheter and the G-tube orifice to keep it open.  No other complaints.  Physical Exam   Triage Vital Signs: ED Triage Vitals  Enc Vitals Group     BP 11/02/22 1114 121/78     Pulse Rate 11/02/22 1114 84     Resp 11/02/22 1114 18     Temp 11/02/22 1114 98.2 F (36.8 C)     Temp src --      SpO2 11/02/22 1114 97 %     Weight --      Height --      Head Circumference --      Peak Flow --      Pain Score 11/02/22 1113 0     Pain Loc --      Pain Edu? --      Excl. in GC? --     Most recent vital signs: Vitals:   11/02/22 1114  BP: 121/78  Pulse: 84  Resp: 18  Temp: 98.2 F (36.8 C)  SpO2: 97%    General: Awake, no distress.  CV:  Good peripheral perfusion.  Regular rate and rhythm  Resp:  Normal effort.   Abd:  No distention.  Soft, nontender.  Foley catheter in G-tube opening.   ED Results / Procedures / Treatments   MEDICATIONS ORDERED IN ED: Medications - No data to display   IMPRESSION / MDM / ASSESSMENT AND PLAN / ED COURSE  I reviewed the triage vital signs and the nursing notes.  Patient's presentation is most consistent with acute illness / injury with system symptoms.  Patient presents to the emergency department for dislodged G-tube.  Patient has a 14 French catheter in the G-tube opening.  I was able to replace the Foley catheter with a 16 [place] G-tube good return of gastric contents.  Inflated with 15 cc of normal saline.  The G-tube has Luer-Lok tips we will attempt to obtain final tips  as well as it is not clear which type of tip is required by the patient's nursing facility.  No complications, no tenderness.  Vital signs reassuring.  Patient will be discharged back to his nursing facility.  FINAL CLINICAL IMPRESSION(S) / ED DIAGNOSES   G-tube dislodgment Replaced G-tube  Note:  This document was prepared using Dragon voice recognition software and may include unintentional dictation errors.   Minna Antis, MD 11/02/22 [phone]

## 2022-11-02 NOTE — ED Notes (Signed)
Pt discharged back to facility. Pt transported via EMS and paperwork given to them. Pt VSS, baseline mental status.

## 2022-11-30 ENCOUNTER — Non-Acute Institutional Stay: Payer: Medicare Other | Admitting: Nurse Practitioner

## 2022-11-30 VITALS — BP 120/77 | HR 80 | Temp 98.2°F | Resp 18 | Wt 167.1 lb

## 2022-11-30 DIAGNOSIS — Z515 Encounter for palliative care: Secondary | ICD-10-CM

## 2022-11-30 DIAGNOSIS — Z8669 Personal history of other diseases of the nervous system and sense organs: Secondary | ICD-10-CM

## 2022-11-30 DIAGNOSIS — E440 Moderate protein-calorie malnutrition: Secondary | ICD-10-CM

## 2022-11-30 DIAGNOSIS — R5381 Other malaise: Secondary | ICD-10-CM

## 2022-11-30 NOTE — Progress Notes (Addendum)
Therapist, nutritional Palliative Care Consult Note Telephone: [phone]  Fax: [phone]    Date of encounter: 11/30/22 2:09 PM PATIENT NAME: Tyvan Hockley [address] [place] [address]   [phone] (home)  DOB: [birth year] MRN: [id number] PRIMARY CARE PROVIDER:    [facility]  RESPONSIBLE PARTY:    Contact Information     Name Relation Home Work Mobile   [facility] Spouse [phone]       I met face to face with patient in facility. Palliative Care was asked to follow this patient by consultation request of  [facility] to address advance care planning and complex medical decision making. This is a follow up visit.                                  ASSESSMENT AND PLAN / RECOMMENDATIONS:  Symptom Management/Plan: 1. ACP; Full code with full scope of treatment; ongoing discussions with wife   2. 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 Continue tube feedings, essential to sustain life.   07/14/2022 weight 163.5 lbs   08/09/2022 weight 160 lbs 09/13/2022 weight 165.7 lbs 10/25/2022 weight 167 lbs 11/15/2022 weight 167.1 lbs 4. Palliative care encounter; Palliative care encounter; Palliative medicine team will continue to support patient, patient's family, and medical team. Visit consisted of counseling and education dealing with the complex and emotionally intense issues of  symptom management and palliative care in the setting of serious and potentially life-threatening illness Follow up Palliative Care Visit: Palliative care will continue to follow for complex medical decision making, advance care planning, and clarification of goals. Return 2 to 8 weeks or prn. I spent 49 minutes providing this consultation. More than 50% of the time in this consultation was spent in counseling and care coordination PPS: 30% Chief Complaint: Follow up palliative consult for complex medical decision making HISTORY OF PRESENT ILLNESS:  Naven Achilefu Bermudes is a 52 y.o. 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 Mr Asis was discharged to Skilled Nursing Facility at [facility]. Mr. Eadie requires total care for mobility, transfers as he is hoyer to recliner chair, turning, positioning, bathing, dressing, incontinence bowel and bladder. Fed though tube feedings essential to sustain life.   Purpose of today PC f/u visit further discussion monitor trends of appetite, weights, monitor for functional, cognitive decline with chronic disease progression, assess any active symptoms, supportive role.  Staff endorses no recent falls, hospitalizations, infections. At present Mr Blasingame is lying in bed, appears comfortable. No visitors present. Mr Lavone Nian does make eye contact, makes noises, no meaningful clear words today. Mr Schwinghammer appears comfortable, no visitors present. Mr Gleed was cooperative with assessment. Support provided.  I called Ms Drum, clinical update discussed. We talked about clinical update pc  visit, goc, no changes will continue with current poc. Medications reviewed. Support provided. 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 Will continue to follow with ongoing discussions monitor trends of appetite, weights, monitor for functional, cognitive decline with chronic disease progression, assess any active symptoms, supportive role.   History obtained from review of EMR, discussion with facility staff and Mr. Orgel.  I reviewed available labs, medications, imaging, studies and related documents from the EMR.  Records reviewed and summarized above.  Physical Exam: General: custodial care, debilitated male, bedbound ENMT: oral mucous membranes moist CV: S1S2, RRR Pulmonary: clear breath sounds, decreased bases Abdomen:  normo-active BS + 4 quadrants, soft and non tender; g-tube MSK: functional quadriplegic Neuro:  + generalized weakness,  + cognitive impairment Psych: non-anxious affect, Alert Thank you for the opportunity to participate in the care of Mr. Carrano. Please call our office at [phone] if we can be of additional assistance.    Yisrael Obryan Prince Rome, NP

## 2023-01-19 IMAGING — CT CT ABD-PELV W/O CM
2 of 4 series · 14 of 46 positions shown, 16 images · non-contrast
Comparison: None.

CLINICAL DATA: Clogged feeding tube. Rash along the right armpit.
Buttock ulceration. Nonlocalized abdominal pain. Hyperglycemia.

EXAM:
CT ABDOMEN AND PELVIS WITHOUT CONTRAST
TECHNIQUE: Multidetector CT imaging of the abdomen and pelvis was performed
following the standard protocol without IV contrast.

[Series 2: axial st · axial · 0.82mm/px · z∈[-959,-529]mm · 11 of 104 slices shown, 13 images]
[im 9/104  soft-tissue]
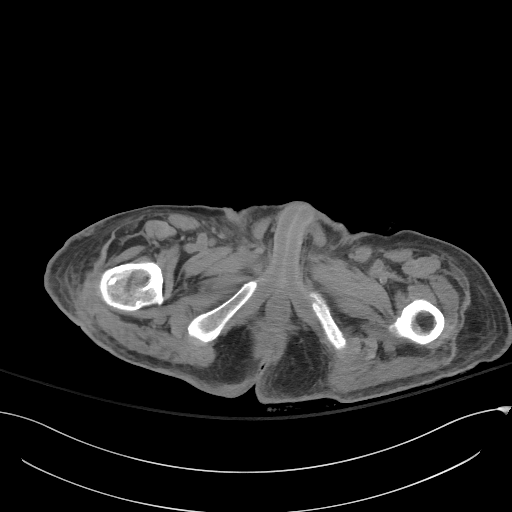
[im 9/104  bone]
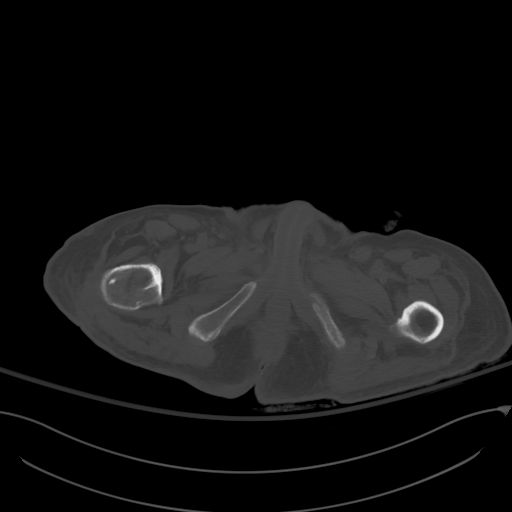
[im 17/104  soft-tissue]
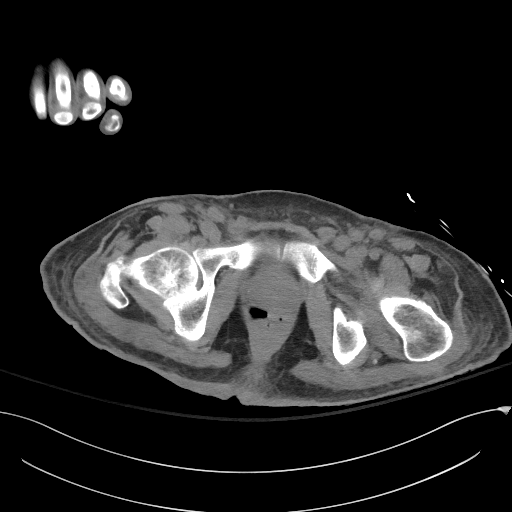
[im 25/104  soft-tissue]
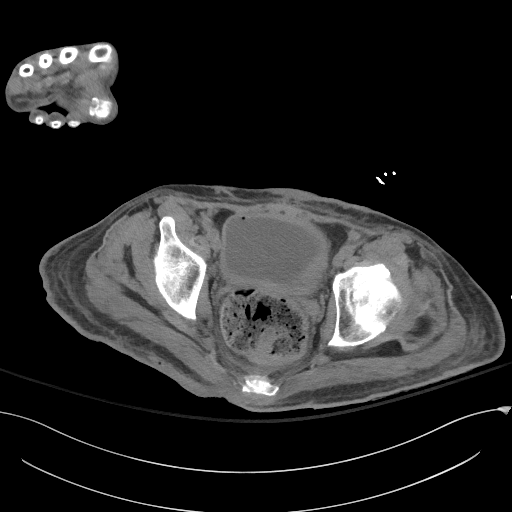
[im 33/104  soft-tissue]
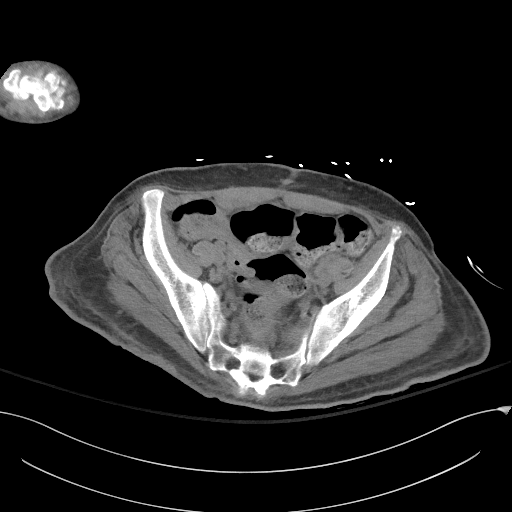
[im 42/104  soft-tissue]
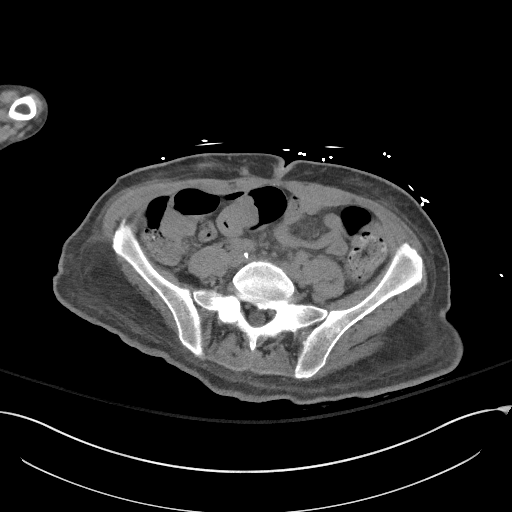
[im 54/104  soft-tissue]
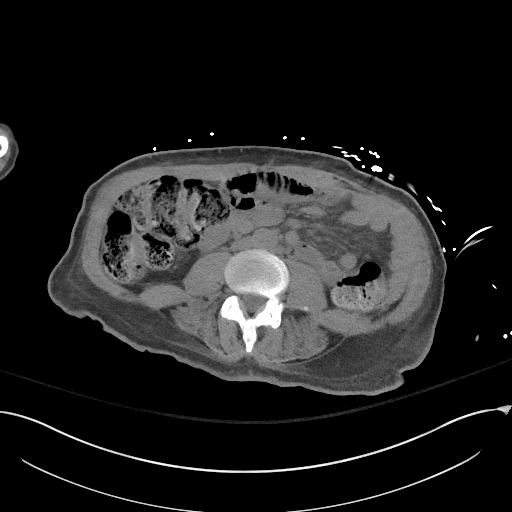
[im 62/104  soft-tissue]
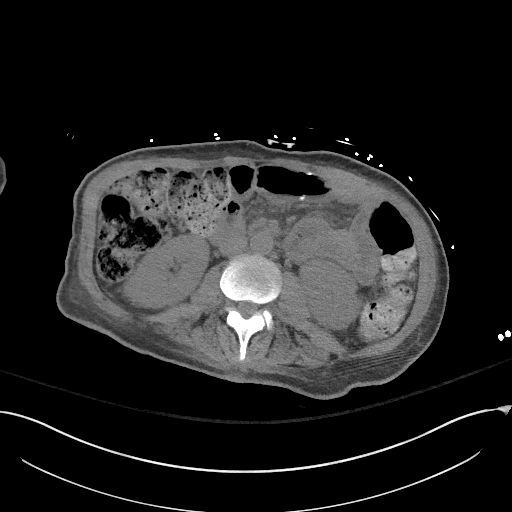
[im 71/104  soft-tissue]
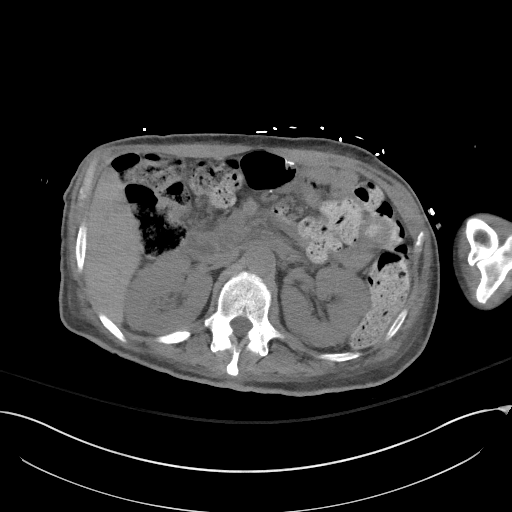
[im 79/104  soft-tissue]
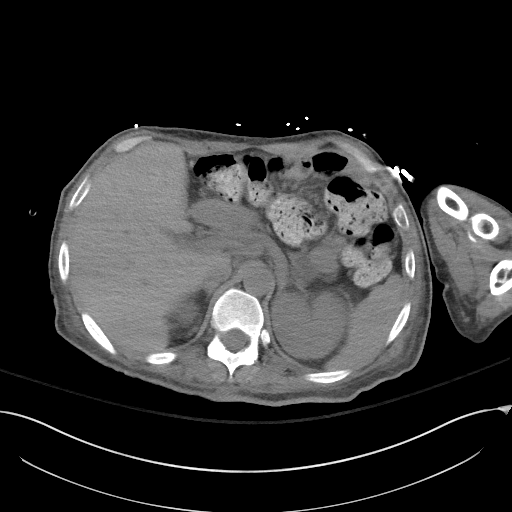
[im 79/104  bone]
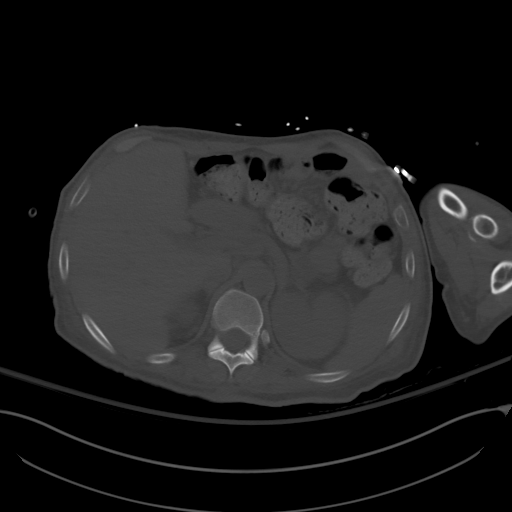
[im 87/104  soft-tissue]
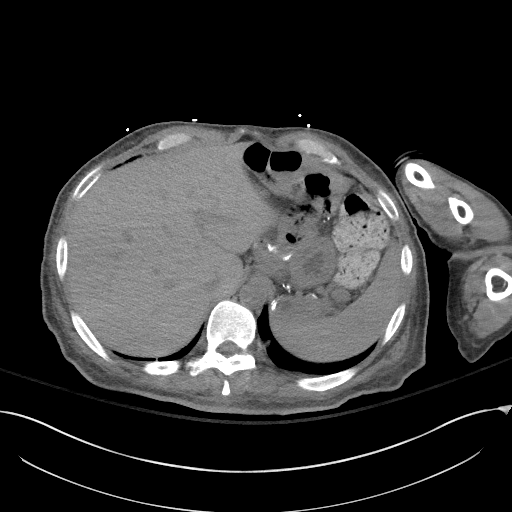
[im 95/104  soft-tissue]
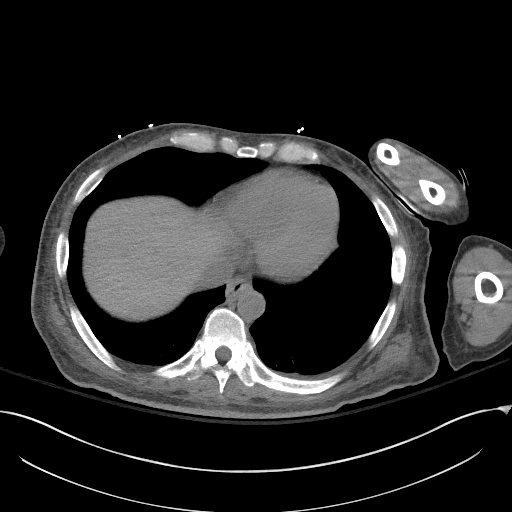

[Series 5: coronal st · coronal · 0.70mm/px · 3 of 80 slices shown]
[im 27/80  soft-tissue]
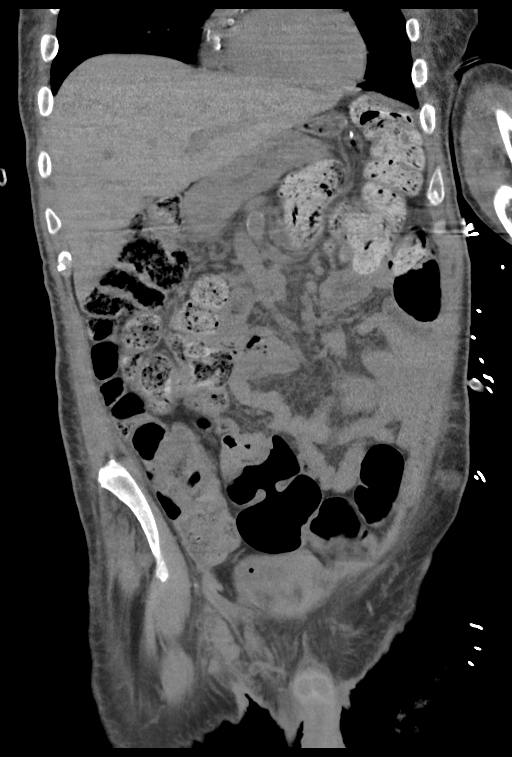
[im 36/80  soft-tissue]
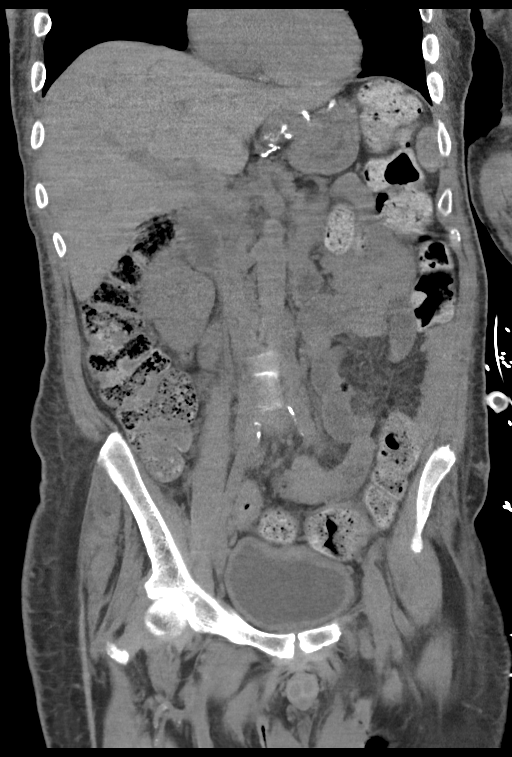
[im 44/80  soft-tissue]
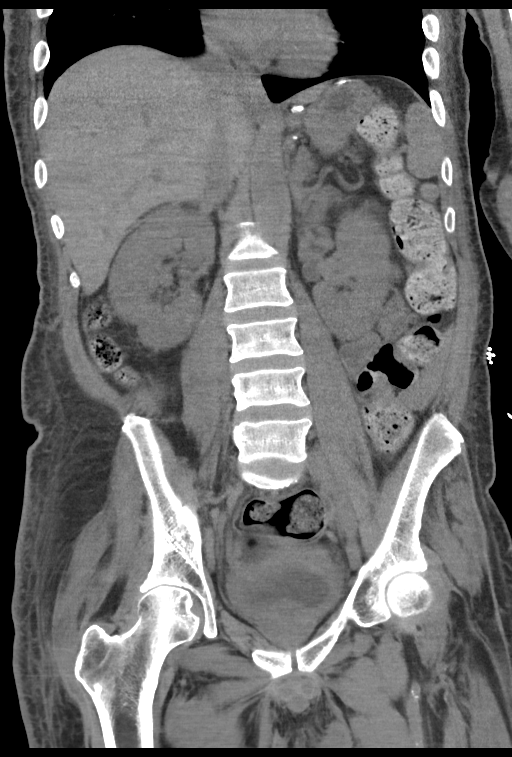

[14 of 46 positions shown; findings below may reference images not displayed]

FINDINGS: Lower chest: Mild airway thickening in both lower lobes with linear
subsegmental atelectasis or scarring in both lower lobes. Right
coronary artery atherosclerotic calcification.

Hepatobiliary: Nonvisualization of the gallbladder, compatible with
gallbladder contraction or prior cholecystectomy. No definite
biliary dilatation.

Pancreas: Unremarkable

Spleen: Unremarkable

Adrenals/Urinary Tract: 2 mm right kidney nonobstructive renal
calculus. Diffuse wall thickening in the urinary bladder with a
trace amount of gas in the urinary bladder. No appreciable
hydronephrosis or hydroureter.

Stomach/Bowel: Postoperative findings favoring gastric bypass. Peg
type tube possibly in a jejunostomy pouch. Correlate with operative
history.

Prominent stool throughout the colon favors constipation.
Circumferential wall thickening in the lower rectum, nonspecific.
Potential wall thickening in nondistended portions of the sigmoid
colon for example in the right abdomen on image 32 series 5.

Vascular/Lymphatic: Bilateral common iliac artery atherosclerotic
calcification. Right inguinal node 1.5 cm in short axis on image 91
series 2. Left inguinal lymph node 1.3 cm in short axis on image 89
series 2. Other inguinal lymph nodes in the sub size range are
present. Right external iliac node 0.8 cm in short axis on image 78
series 2. Left external iliac node with 0.9 cm in short axis, image
76 series 2. Left periaortic lymph node 1.0 cm in short axis, image
39 series 2.

Reproductive: Unremarkable

Other: Diffuse subcutaneous and mesenteric edema. Presacral
stranding.

Musculoskeletal: Truncated coccygeal segments on image 68 series 6
with surrounding stranding, significance uncertain but inflammation
in this vicinity is not excluded. There seem to be small locules of
gas along the cutaneous or immediately subcutaneous surface along
the right lower back, correlate with any ulceration in this region.
IMPRESSION: 1. Small amount of subcutaneous gas along the right upper
buttock/lower back region with cutaneous and subcutaneous thickening
suggesting cellulitis/infection. This extends down to the
sacrococcygeal margin with some truncation of the lower coccyx such
that chronic infection/chronic osteomyelitis cannot be excluded.
Presacral edema is noted in this region.
2. Peg tube in a pouchlike bowel loop presumably a jejunostomy
pouch. Postoperative findings favoring gastric bypass. Correlate
with operative history.
3.  Prominent stool throughout the colon favors constipation.
4. Circumferential wall thickening in the lower rectum and in parts
of the sigmoid colon, nonspecific.
5. Mild right inguinal adenopathy with upper normal sized left
inguinal and external iliac nodes, possibly reactive but technically
nonspecific.
6. Airway thickening is present, suggesting bronchitis or reactive
airways disease.
7. Right coronary artery atherosclerosis.
8. Nonobstructive right nephrolithiasis.
9. Diffuse wall thickening in the urinary bladder with a trace
amount of gas. The gas might possibly be from recent
catheterization, correlate with patient history. The urinary bladder
wall thickening is suspicious for cystitis.
10. Diffuse subcutaneous and mesenteric edema suggesting third
spacing of fluid.

## 2023-08-26 ENCOUNTER — Inpatient Hospital Stay
Admission: EM | Admit: 2023-08-26 | Discharge: 2023-09-01 | DRG: 871 | Disposition: A | Discharge status: Skilled Nursing Facility | Payer: Medicare Other | Source: Skilled Nursing Facility | Attending: Internal Medicine | Admitting: Internal Medicine

## 2023-08-26 ENCOUNTER — Other Ambulatory Visit: Payer: Self-pay

## 2023-08-26 ENCOUNTER — Emergency Department: Payer: Medicare Other

## 2023-08-26 DIAGNOSIS — N390 Urinary tract infection, site not specified: Secondary | ICD-10-CM

## 2023-08-26 DIAGNOSIS — E440 Moderate protein-calorie malnutrition: Secondary | ICD-10-CM | POA: Diagnosis present

## 2023-08-26 DIAGNOSIS — R6521 Severe sepsis with septic shock: Secondary | ICD-10-CM | POA: Diagnosis present

## 2023-08-26 DIAGNOSIS — N3001 Acute cystitis with hematuria: Secondary | ICD-10-CM | POA: Diagnosis present

## 2023-08-26 DIAGNOSIS — I110 Hypertensive heart disease with heart failure: Secondary | ICD-10-CM | POA: Diagnosis present

## 2023-08-26 DIAGNOSIS — R319 Hematuria, unspecified: Secondary | ICD-10-CM

## 2023-08-26 DIAGNOSIS — L732 Hidradenitis suppurativa: Secondary | ICD-10-CM | POA: Diagnosis present

## 2023-08-26 DIAGNOSIS — G40909 Epilepsy, unspecified, not intractable, without status epilepticus: Secondary | ICD-10-CM | POA: Diagnosis present

## 2023-08-26 DIAGNOSIS — A419 Sepsis, unspecified organism: Secondary | ICD-10-CM | POA: Diagnosis present

## 2023-08-26 DIAGNOSIS — Z9884 Bariatric surgery status: Secondary | ICD-10-CM

## 2023-08-26 DIAGNOSIS — N179 Acute kidney failure, unspecified: Secondary | ICD-10-CM | POA: Diagnosis present

## 2023-08-26 DIAGNOSIS — R532 Functional quadriplegia: Secondary | ICD-10-CM | POA: Diagnosis present

## 2023-08-26 DIAGNOSIS — E875 Hyperkalemia: Secondary | ICD-10-CM | POA: Diagnosis present

## 2023-08-26 DIAGNOSIS — R509 Fever, unspecified: Secondary | ICD-10-CM

## 2023-08-26 DIAGNOSIS — G931 Anoxic brain damage, not elsewhere classified: Secondary | ICD-10-CM | POA: Diagnosis present

## 2023-08-26 DIAGNOSIS — A411 Sepsis due to other specified staphylococcus: Secondary | ICD-10-CM | POA: Diagnosis not present

## 2023-08-26 DIAGNOSIS — E878 Other disorders of electrolyte and fluid balance, not elsewhere classified: Secondary | ICD-10-CM

## 2023-08-26 DIAGNOSIS — L89611 Pressure ulcer of right heel, stage 1: Secondary | ICD-10-CM | POA: Diagnosis present

## 2023-08-26 DIAGNOSIS — E872 Acidosis, unspecified: Secondary | ICD-10-CM | POA: Diagnosis present

## 2023-08-26 DIAGNOSIS — Z1152 Encounter for screening for COVID-19: Secondary | ICD-10-CM

## 2023-08-26 DIAGNOSIS — Z8669 Personal history of other diseases of the nervous system and sense organs: Secondary | ICD-10-CM

## 2023-08-26 DIAGNOSIS — E1165 Type 2 diabetes mellitus with hyperglycemia: Secondary | ICD-10-CM | POA: Diagnosis present

## 2023-08-26 DIAGNOSIS — E860 Dehydration: Secondary | ICD-10-CM | POA: Diagnosis present

## 2023-08-26 DIAGNOSIS — J449 Chronic obstructive pulmonary disease, unspecified: Secondary | ICD-10-CM | POA: Diagnosis present

## 2023-08-26 DIAGNOSIS — D696 Thrombocytopenia, unspecified: Secondary | ICD-10-CM | POA: Diagnosis present

## 2023-08-26 DIAGNOSIS — Z931 Gastrostomy status: Secondary | ICD-10-CM

## 2023-08-26 DIAGNOSIS — E871 Hypo-osmolality and hyponatremia: Secondary | ICD-10-CM | POA: Diagnosis present

## 2023-08-26 DIAGNOSIS — G9341 Metabolic encephalopathy: Secondary | ICD-10-CM | POA: Diagnosis present

## 2023-08-26 DIAGNOSIS — Z6822 Body mass index (BMI) 22.0-22.9, adult: Secondary | ICD-10-CM

## 2023-08-26 DIAGNOSIS — E870 Hyperosmolality and hypernatremia: Secondary | ICD-10-CM | POA: Diagnosis present

## 2023-08-26 DIAGNOSIS — R4182 Altered mental status, unspecified: Principal | ICD-10-CM

## 2023-08-26 DIAGNOSIS — Z794 Long term (current) use of insulin: Secondary | ICD-10-CM

## 2023-08-26 DIAGNOSIS — I5022 Chronic systolic (congestive) heart failure: Secondary | ICD-10-CM | POA: Diagnosis present

## 2023-08-26 DIAGNOSIS — L89892 Pressure ulcer of other site, stage 2: Secondary | ICD-10-CM | POA: Diagnosis present

## 2023-08-26 DIAGNOSIS — R6520 Severe sepsis without septic shock: Secondary | ICD-10-CM | POA: Diagnosis present

## 2023-08-26 LAB — CBC WITH DIFFERENTIAL/PLATELET
Abs Immature Granulocytes: 0.1 10*3/uL — ABNORMAL HIGH (ref 0.00–0.07)
Basophils Absolute: 0.1 10*3/uL (ref 0.0–0.1)
Basophils Relative: 0 %
Eosinophils Absolute: 0.1 10*3/uL (ref 0.0–0.5)
Eosinophils Relative: 1 %
HCT: 45.6 % (ref 39.0–52.0)
Hemoglobin: 13.6 g/dL (ref 13.0–17.0)
Immature Granulocytes: 1 %
Lymphocytes Relative: 19 %
Lymphs Abs: 3.7 10*3/uL (ref 0.7–4.0)
MCH: 27.4 pg (ref 26.0–34.0)
MCHC: 29.8 g/dL — ABNORMAL LOW (ref 30.0–36.0)
MCV: 91.8 fL (ref 80.0–100.0)
Monocytes Absolute: 1.9 10*3/uL — ABNORMAL HIGH (ref 0.1–1.0)
Monocytes Relative: 10 %
Neutro Abs: 13.5 10*3/uL — ABNORMAL HIGH (ref 1.7–7.7)
Neutrophils Relative %: 69 %
Platelets: 249 10*3/uL (ref 150–400)
RBC: 4.97 MIL/uL (ref 4.22–5.81)
RDW: 14.5 % (ref 11.5–15.5)
WBC: 19.3 10*3/uL — ABNORMAL HIGH (ref 4.0–10.5)
nRBC: 0.2 % (ref 0.0–0.2)

## 2023-08-26 LAB — CBG MONITORING, ED
Glucose-Capillary: 521 mg/dL (ref 70–99)
Glucose-Capillary: 550 mg/dL (ref 70–99)
Glucose-Capillary: 426 mg/dL — ABNORMAL HIGH (ref 70–99)
Glucose-Capillary: 505 mg/dL (ref 70–99)

## 2023-08-26 LAB — RESP PANEL BY RT-PCR (RSV, FLU A&B, COVID)  RVPGX2
Influenza A by PCR: NEGATIVE
Influenza B by PCR: NEGATIVE
Resp Syncytial Virus by PCR: NEGATIVE
SARS Coronavirus 2 by RT PCR: NEGATIVE

## 2023-08-26 LAB — PROTIME-INR
INR: 1.2 (ref 0.8–1.2)
Prothrombin Time: 15 s (ref 11.4–15.2)

## 2023-08-26 LAB — LACTIC ACID, PLASMA
Lactic Acid, Venous: 4.5 mmol/L (ref 0.5–1.9)
Lactic Acid, Venous: 3.7 mmol/L (ref 0.5–1.9)

## 2023-08-26 MED ORDER — INSULIN ASPART 100 UNIT/ML IJ SOLN
10.0000 [IU] | Freq: Once | INTRAMUSCULAR | Status: AC
  Administered 2023-08-26: 10 [IU] via INTRAVENOUS
  Filled 2023-08-26: qty 1

## 2023-08-26 MED ORDER — LACTATED RINGERS IV BOLUS
1000.0000 mL | Freq: Once | INTRAVENOUS | Status: AC
  Administered 2023-08-26: 1000 mL via INTRAVENOUS

## 2023-08-26 MED ORDER — IOHEXOL 300 MG/ML  SOLN
80.0000 mL | Freq: Once | INTRAMUSCULAR | Status: AC | PRN
  Administered 2023-08-26: 80 mL via INTRAVENOUS

## 2023-08-26 MED ORDER — ACETAMINOPHEN 325 MG RE SUPP
650.0000 mg | Freq: Once | RECTAL | Status: AC
  Administered 2023-08-26: 650 mg via RECTAL
  Filled 2023-08-26: qty 2

## 2023-08-26 MED ORDER — METRONIDAZOLE 500 MG/100ML IV SOLN
500.0000 mg | Freq: Once | INTRAVENOUS | Status: AC
  Administered 2023-08-26: 500 mg via INTRAVENOUS
  Filled 2023-08-26: qty 100

## 2023-08-26 MED ORDER — SODIUM CHLORIDE 0.9 % IV SOLN
2.0000 g | Freq: Once | INTRAVENOUS | Status: AC
  Administered 2023-08-26: 2 g via INTRAVENOUS
  Filled 2023-08-26: qty 12.5

## 2023-08-26 MED ORDER — VANCOMYCIN HCL IN DEXTROSE 1-5 GM/200ML-% IV SOLN
1000.0000 mg | Freq: Once | INTRAVENOUS | Status: AC
  Administered 2023-08-26: 1000 mg via INTRAVENOUS
  Filled 2023-08-26: qty 200

## 2023-08-26 NOTE — ED Provider Notes (Signed)
[facility] Provider Note    Event Date/Time   First MD Initiated Contact with Patient 08/26/23 1716     (approximate)   History   Fever and Emesis   HPI  Evan Good is a 53 y.o. male who presents to the emergency department today from his living facility because of concerns for abnormal behavior and fever.  Patient himself is unable to give any history.  Wife is at bedside and states that he has been acting slightly abnormal.   Physical Exam   Triage Vital Signs: ED Triage Vitals  Encounter Vitals Group     BP 08/26/23 1715 126/72     Systolic BP Percentile --      Diastolic BP Percentile --      Pulse Rate 08/26/23 1715 (!) 126     Resp 08/26/23 1715 (!) 21     Temp 08/26/23 1715 (!) 102.1 F (38.9 C)     Temp Source 08/26/23 1715 Rectal     SpO2 08/26/23 1715 (!) 88 %     Weight 08/26/23 1726 160 lb (72.6 kg)     Height 08/26/23 1726 5\' 11"  (1.803 m)     Head Circumference --      Peak Flow --      Pain Score --      Pain Loc --      Pain Education --      Exclude from Growth Chart --     Most recent vital signs: Vitals:   08/26/23 1715  BP: 126/72  Pulse: (!) 126  Resp: (!) 21  Temp: (!) 102.1 F (38.9 C)  SpO2: (!) 88%   General: Awake, alert, non verbal. Occasional moaning. CV:  Good peripheral perfusion. Tachycardia, regular rhythm. Resp:  Normal effort. Lungs clear. Abd:  No distention. Non tender.   ED Results / Procedures / Treatments   Labs (all labs ordered are listed, but only abnormal results are displayed) Labs Reviewed  COMPREHENSIVE METABOLIC PANEL - Abnormal; Notable for the following components:      Result Value   Sodium 149 (*)    Potassium 5.5 (*)    Glucose, Bld 658 (*)    BUN 119 (*)    Creatinine, Ser 1.97 (*)    Total Protein 8.2 (*)    Albumin 3.1 (*)    ALT 63 (*)    Alkaline Phosphatase 131 (*)    GFR, Estimated 40 (*)    All other components within normal limits  LACTIC  ACID, PLASMA - Abnormal; Notable for the following components:   Lactic Acid, Venous 4.5 (*)    All other components within normal limits  CBC WITH DIFFERENTIAL/PLATELET - Abnormal; Notable for the following components:   WBC 19.3 (*)    MCHC 29.8 (*)    Neutro Abs 13.5 (*)    Monocytes Absolute 1.9 (*)    Abs Immature Granulocytes 0.10 (*)    All other components within normal limits  CBG MONITORING, ED - Abnormal; Notable for the following components:   Glucose-Capillary 521 (*)    All other components within normal limits  RESP PANEL BY RT-PCR (RSV, FLU A&B, COVID)  RVPGX2  CULTURE, BLOOD (ROUTINE X 2)  CULTURE, BLOOD (ROUTINE X 2)  PROTIME-INR  LACTIC ACID, PLASMA  URINALYSIS, W/ REFLEX TO CULTURE (INFECTION SUSPECTED)     EKG  None   RADIOLOGY I independently interpreted and visualized the CXR. My interpretation: No pneumonia Radiology interpretation:  IMPRESSION:  Low lung volumes with no active disease.     PROCEDURES:  Critical Care performed: Yes  CRITICAL CARE Performed by: Phineas Semen   Total critical care time: 35 minutes  Critical care time was exclusive of separately billable procedures and treating other patients.  Critical care was necessary to treat or prevent imminent or life-threatening deterioration.  Critical care was time spent personally by me on the following activities: development of treatment plan with patient and/or surrogate as well as nursing, discussions with consultants, evaluation of patient's response to treatment, examination of patient, obtaining history from patient or surrogate, ordering and performing treatments and interventions, ordering and review of laboratory studies, ordering and review of radiographic studies, pulse oximetry and re-evaluation of patient's condition.   Procedures    MEDICATIONS ORDERED IN ED: Medications  ceFEPIme (MAXIPIME) 2 g in sodium chloride 0.9 % 100 mL IVPB (has no administration in time  range)  vancomycin (VANCOCIN) IVPB 1000 mg/200 mL premix (has no administration in time range)  metroNIDAZOLE (FLAGYL) IVPB 500 mg (has no administration in time range)  lactated ringers bolus 1,000 mL (has no administration in time range)     IMPRESSION / MDM / ASSESSMENT AND PLAN / ED COURSE  I reviewed the triage vital signs and the nursing notes.                              Differential diagnosis includes, but is not limited to, pneumonia, UTI, viral infection  Patient's presentation is most consistent with acute presentation with potential threat to life or bodily function.   The patient is on the cardiac monitor to evaluate for evidence of arrhythmia and/or significant heart rate changes.  Patient presents to the emergency department today because of concerns for altered mental status and fever.  Patient was found to be febrile and tachycardic here in the emergency department.  Patient was started on broad-spectrum IV antibiotics.  Blood work was notable for leukocytosis and lactic acidosis.  IV fluids were started.  Chest x-ray without obvious pneumonia. Blood work does show significant glucose elevation but no anion gap. At this time have lower concern for dka.   Patient glucose without significant improvement after IV fluids, will give IV insulin. Lactic acid level did come down slightly after fluids. Did obtain a CT abd/pel given unclear etiology of the infection. This did not show any significant or concerning findings. Awaiting urine. Will discuss with the hospitalist service for admission.      FINAL CLINICAL IMPRESSION(S) / ED DIAGNOSES   Final diagnoses:  Altered mental status, unspecified altered mental status type  Fever, unspecified fever cause      Note:  This document was prepared using Dragon voice recognition software and may include unintentional dictation errors.    Phineas Semen, MD 08/26/23 2308

## 2023-08-26 NOTE — ED Triage Notes (Addendum)
"  They said he was not acting like himself and fever and vomiting from [employer]" per EMS  On arrival patient non verbal (unsure of baseline). Moans to movement and stimuli. Small amount of bright red blood with clots noted in diaper around meatus. Mostly every skin fold oozing yellow foul smelling liquid. Gauze in place from facility. CBG 521 Temp 102.1 rectal

## 2023-08-26 NOTE — ED Notes (Signed)
Pt to CT

## 2023-08-27 DIAGNOSIS — R6520 Severe sepsis without septic shock: Secondary | ICD-10-CM

## 2023-08-27 DIAGNOSIS — R532 Functional quadriplegia: Secondary | ICD-10-CM | POA: Diagnosis present

## 2023-08-27 DIAGNOSIS — R7881 Bacteremia: Secondary | ICD-10-CM | POA: Diagnosis not present

## 2023-08-27 DIAGNOSIS — E860 Dehydration: Secondary | ICD-10-CM | POA: Diagnosis present

## 2023-08-27 DIAGNOSIS — N390 Urinary tract infection, site not specified: Secondary | ICD-10-CM | POA: Diagnosis not present

## 2023-08-27 DIAGNOSIS — Z931 Gastrostomy status: Secondary | ICD-10-CM

## 2023-08-27 DIAGNOSIS — R319 Hematuria, unspecified: Secondary | ICD-10-CM | POA: Diagnosis not present

## 2023-08-27 DIAGNOSIS — G40909 Epilepsy, unspecified, not intractable, without status epilepticus: Secondary | ICD-10-CM | POA: Diagnosis present

## 2023-08-27 DIAGNOSIS — I110 Hypertensive heart disease with heart failure: Secondary | ICD-10-CM | POA: Diagnosis present

## 2023-08-27 DIAGNOSIS — E871 Hypo-osmolality and hyponatremia: Secondary | ICD-10-CM | POA: Diagnosis present

## 2023-08-27 DIAGNOSIS — R6521 Severe sepsis with septic shock: Secondary | ICD-10-CM | POA: Diagnosis present

## 2023-08-27 DIAGNOSIS — L89892 Pressure ulcer of other site, stage 2: Secondary | ICD-10-CM | POA: Diagnosis present

## 2023-08-27 DIAGNOSIS — N3001 Acute cystitis with hematuria: Secondary | ICD-10-CM | POA: Diagnosis present

## 2023-08-27 DIAGNOSIS — G9341 Metabolic encephalopathy: Secondary | ICD-10-CM

## 2023-08-27 DIAGNOSIS — E440 Moderate protein-calorie malnutrition: Secondary | ICD-10-CM | POA: Diagnosis present

## 2023-08-27 DIAGNOSIS — G931 Anoxic brain damage, not elsewhere classified: Secondary | ICD-10-CM | POA: Diagnosis present

## 2023-08-27 DIAGNOSIS — A411 Sepsis due to other specified staphylococcus: Secondary | ICD-10-CM | POA: Diagnosis present

## 2023-08-27 DIAGNOSIS — E1165 Type 2 diabetes mellitus with hyperglycemia: Secondary | ICD-10-CM | POA: Diagnosis present

## 2023-08-27 DIAGNOSIS — N179 Acute kidney failure, unspecified: Secondary | ICD-10-CM | POA: Diagnosis present

## 2023-08-27 DIAGNOSIS — A419 Sepsis, unspecified organism: Secondary | ICD-10-CM | POA: Diagnosis present

## 2023-08-27 DIAGNOSIS — E870 Hyperosmolality and hypernatremia: Secondary | ICD-10-CM | POA: Diagnosis present

## 2023-08-27 DIAGNOSIS — R509 Fever, unspecified: Secondary | ICD-10-CM | POA: Diagnosis not present

## 2023-08-27 DIAGNOSIS — D696 Thrombocytopenia, unspecified: Secondary | ICD-10-CM | POA: Diagnosis present

## 2023-08-27 DIAGNOSIS — E872 Acidosis, unspecified: Secondary | ICD-10-CM | POA: Diagnosis present

## 2023-08-27 DIAGNOSIS — Z1152 Encounter for screening for COVID-19: Secondary | ICD-10-CM | POA: Diagnosis not present

## 2023-08-27 DIAGNOSIS — J449 Chronic obstructive pulmonary disease, unspecified: Secondary | ICD-10-CM | POA: Diagnosis present

## 2023-08-27 DIAGNOSIS — Z9884 Bariatric surgery status: Secondary | ICD-10-CM | POA: Diagnosis not present

## 2023-08-27 DIAGNOSIS — E878 Other disorders of electrolyte and fluid balance, not elsewhere classified: Secondary | ICD-10-CM

## 2023-08-27 DIAGNOSIS — E875 Hyperkalemia: Secondary | ICD-10-CM | POA: Diagnosis present

## 2023-08-27 DIAGNOSIS — I5022 Chronic systolic (congestive) heart failure: Secondary | ICD-10-CM | POA: Diagnosis present

## 2023-08-27 DIAGNOSIS — Z794 Long term (current) use of insulin: Secondary | ICD-10-CM | POA: Diagnosis not present

## 2023-08-27 LAB — HIV ANTIBODY (ROUTINE TESTING W REFLEX): HIV Screen 4th Generation wRfx: NONREACTIVE

## 2023-08-27 LAB — COMPREHENSIVE METABOLIC PANEL
ALT: 63 U/L — ABNORMAL HIGH (ref 0–44)
AST: 32 U/L (ref 15–41)
Albumin: 3.1 g/dL — ABNORMAL LOW (ref 3.5–5.0)
Alkaline Phosphatase: 131 U/L — ABNORMAL HIGH (ref 38–126)
Anion gap: 13 (ref 5–15)
BUN: 119 mg/dL — ABNORMAL HIGH (ref 6–20)
CO2: 27 mmol/L (ref 22–32)
Calcium: 9.3 mg/dL (ref 8.9–10.3)
Chloride: 109 mmol/L (ref 98–111)
Creatinine, Ser: 1.97 mg/dL — ABNORMAL HIGH (ref 0.61–1.24)
GFR, Estimated: 40 mL/min — ABNORMAL LOW (ref >60)
Glucose, Bld: 658 mg/dL (ref 70–99)
Potassium: 5.5 mmol/L — ABNORMAL HIGH (ref 3.5–5.1)
Sodium: 149 mmol/L — ABNORMAL HIGH (ref 135–145)
Total Bilirubin: 0.6 mg/dL (ref 0.0–1.2)
Total Protein: 8.2 g/dL — ABNORMAL HIGH (ref 6.5–8.1)

## 2023-08-27 LAB — URINALYSIS, W/ REFLEX TO CULTURE (INFECTION SUSPECTED)
Bacteria, UA: NONE SEEN
Bilirubin Urine: NEGATIVE
Glucose, UA: >=500 mg/dL — AB
Ketones, ur: NEGATIVE mg/dL
Nitrite: NEGATIVE
Protein, ur: 100 mg/dL — AB
RBC / HPF: >50 RBC/hpf (ref 0–5)
Specific Gravity, Urine: 1.023 (ref 1.005–1.030)
pH: 6 (ref 5.0–8.0)

## 2023-08-27 LAB — CBG MONITORING, ED
Glucose-Capillary: 450 mg/dL — ABNORMAL HIGH (ref 70–99)
Glucose-Capillary: 461 mg/dL — ABNORMAL HIGH (ref 70–99)
Glucose-Capillary: 391 mg/dL — ABNORMAL HIGH (ref 70–99)
Glucose-Capillary: 372 mg/dL — ABNORMAL HIGH (ref 70–99)
Glucose-Capillary: 308 mg/dL — ABNORMAL HIGH (ref 70–99)
Glucose-Capillary: 337 mg/dL — ABNORMAL HIGH (ref 70–99)

## 2023-08-27 LAB — BLOOD CULTURE ID PANEL (REFLEXED) - BCID2

## 2023-08-27 LAB — BASIC METABOLIC PANEL
Anion gap: 13 (ref 5–15)
Anion gap: 11 (ref 5–15)
BUN: 100 mg/dL — ABNORMAL HIGH (ref 6–20)
BUN: 75 mg/dL — ABNORMAL HIGH (ref 6–20)
CO2: 27 mmol/L (ref 22–32)
CO2: 27 mmol/L (ref 22–32)
Calcium: 9.1 mg/dL (ref 8.9–10.3)
Calcium: 9.3 mg/dL (ref 8.9–10.3)
Chloride: 111 mmol/L (ref 98–111)
Chloride: 117 mmol/L — ABNORMAL HIGH (ref 98–111)
Creatinine, Ser: 1.44 mg/dL — ABNORMAL HIGH (ref 0.61–1.24)
Creatinine, Ser: 1.11 mg/dL (ref 0.61–1.24)
GFR, Estimated: 58 mL/min — ABNORMAL LOW (ref >60)
GFR, Estimated: >60 mL/min (ref >60)
Glucose, Bld: 528 mg/dL (ref 70–99)
Glucose, Bld: 335 mg/dL — ABNORMAL HIGH (ref 70–99)
Potassium: 4.5 mmol/L (ref 3.5–5.1)
Potassium: 4.1 mmol/L (ref 3.5–5.1)
Sodium: 151 mmol/L — ABNORMAL HIGH (ref 135–145)
Sodium: 155 mmol/L — ABNORMAL HIGH (ref 135–145)

## 2023-08-27 LAB — GLUCOSE, CAPILLARY
Glucose-Capillary: 291 mg/dL — ABNORMAL HIGH (ref 70–99)
Glucose-Capillary: 265 mg/dL — ABNORMAL HIGH (ref 70–99)

## 2023-08-27 LAB — OSMOLALITY: Osmolality: 375 mosm/kg (ref 275–295)

## 2023-08-27 LAB — CORTISOL-AM, BLOOD: Cortisol - AM: 24.9 ug/dL — ABNORMAL HIGH (ref 6.7–22.6)

## 2023-08-27 LAB — HEMOGLOBIN A1C
Hgb A1c MFr Bld: 9.9 % — ABNORMAL HIGH (ref 4.8–5.6)
Mean Plasma Glucose: 237.43 mg/dL

## 2023-08-27 MED ORDER — ACETAMINOPHEN 325 MG PO TABS: 650.0000 mg | ORAL_TABLET | Freq: Four times a day (QID) | ORAL | Status: DC | PRN

## 2023-08-27 MED ORDER — LEVETIRACETAM 500 MG PO TABS
1000.0000 mg | ORAL_TABLET | Freq: Two times a day (BID) | ORAL | Status: DC
  Administered 2023-08-27 – 2023-09-01 (×11): 1000 mg
  Filled 2023-08-27 (×11): qty 2

## 2023-08-27 MED ORDER — OSMOLITE 1.5 CAL PO LIQD
1000.0000 mL | ORAL | Status: DC
  Administered 2023-08-27 (×2): 1000 mL

## 2023-08-27 MED ORDER — ACETAMINOPHEN 650 MG RE SUPP: 650.0000 mg | Freq: Four times a day (QID) | RECTAL | Status: DC | PRN

## 2023-08-27 MED ORDER — ONDANSETRON HCL 4 MG PO TABS: 4.0000 mg | ORAL_TABLET | Freq: Four times a day (QID) | ORAL | Status: DC | PRN

## 2023-08-27 MED ORDER — METOPROLOL TARTRATE 5 MG/5ML IV SOLN
5.0000 mg | Freq: Four times a day (QID) | INTRAVENOUS | Status: AC
  Administered 2023-08-27 – 2023-08-29 (×8): 5 mg via INTRAVENOUS
  Filled 2023-08-27 (×8): qty 5

## 2023-08-27 MED ORDER — INSULIN ASPART 100 UNIT/ML IJ SOLN
0.0000 [IU] | INTRAMUSCULAR | Status: DC
  Administered 2023-08-27: 9 [IU] via SUBCUTANEOUS
  Administered 2023-08-27: 5 [IU] via SUBCUTANEOUS
  Administered 2023-08-27: 7 [IU] via SUBCUTANEOUS
  Administered 2023-08-27: 5 [IU] via SUBCUTANEOUS
  Administered 2023-08-27: 9 [IU] via SUBCUTANEOUS
  Administered 2023-08-28 (×6): 5 [IU] via SUBCUTANEOUS
  Administered 2023-08-29: 6 [IU] via SUBCUTANEOUS
  Administered 2023-08-29 (×2): 7 [IU] via SUBCUTANEOUS
  Administered 2023-08-29 (×2): 5 [IU] via SUBCUTANEOUS
  Administered 2023-08-30: 2 [IU] via SUBCUTANEOUS
  Administered 2023-08-30: 1 [IU] via SUBCUTANEOUS
  Administered 2023-08-30: 2 [IU] via SUBCUTANEOUS
  Administered 2023-08-30: 1 [IU] via SUBCUTANEOUS
  Administered 2023-08-30: 2 [IU] via SUBCUTANEOUS
  Administered 2023-08-30: 1 [IU] via SUBCUTANEOUS
  Administered 2023-08-31 (×2): 2 [IU] via SUBCUTANEOUS
  Administered 2023-08-31 (×2): 3 [IU] via SUBCUTANEOUS
  Administered 2023-08-31: 2 [IU] via SUBCUTANEOUS
  Administered 2023-09-01 (×2): 3 [IU] via SUBCUTANEOUS
  Administered 2023-09-01: 1 [IU] via SUBCUTANEOUS
  Administered 2023-09-01: 2 [IU] via SUBCUTANEOUS
  Filled 2023-08-27 (×28): qty 1

## 2023-08-27 MED ORDER — FREE WATER
100.0000 mL | Status: DC
  Administered 2023-08-27 – 2023-08-30 (×15): 100 mL

## 2023-08-27 MED ORDER — LACTATED RINGERS IV SOLN
150.0000 mL/h | INTRAVENOUS | Status: AC
  Administered 2023-08-27 (×2): 150 mL/h via INTRAVENOUS

## 2023-08-27 MED ORDER — CHLORHEXIDINE GLUCONATE 0.12 % MT SOLN
10.0000 mL | Freq: Two times a day (BID) | OROMUCOSAL | Status: DC
  Administered 2023-08-27 – 2023-09-01 (×9): 10 mL via OROMUCOSAL
  Filled 2023-08-27 (×9): qty 15

## 2023-08-27 MED ORDER — ONDANSETRON HCL 4 MG/2ML IJ SOLN: 4.0000 mg | Freq: Four times a day (QID) | INTRAMUSCULAR | Status: DC | PRN

## 2023-08-27 MED ORDER — ENOXAPARIN SODIUM 40 MG/0.4ML IJ SOSY
40.0000 mg | PREFILLED_SYRINGE | INTRAMUSCULAR | Status: DC
Start: 2023-08-27 — End: 2023-09-01
  Administered 2023-08-27 – 2023-09-01 (×6): 40 mg via SUBCUTANEOUS
  Filled 2023-08-27 (×6): qty 0.4

## 2023-08-27 MED ORDER — SODIUM CHLORIDE 0.9 % IV SOLN: 2.0000 g | INTRAVENOUS | Status: DC

## 2023-08-27 MED ORDER — PANTOPRAZOLE SODIUM 40 MG IV SOLR
40.0000 mg | INTRAVENOUS | Status: DC
  Administered 2023-08-27 – 2023-09-01 (×6): 40 mg via INTRAVENOUS
  Filled 2023-08-27 (×6): qty 10

## 2023-08-27 MED ORDER — INSULIN ASPART 100 UNIT/ML IJ SOLN
6.0000 [IU] | Freq: Once | INTRAMUSCULAR | Status: AC
  Administered 2023-08-27: 6 [IU] via INTRAVENOUS
  Filled 2023-08-27: qty 1

## 2023-08-27 MED ORDER — INSULIN GLARGINE-YFGN 100 UNIT/ML ~~LOC~~ SOLN
20.0000 [IU] | Freq: Every day | SUBCUTANEOUS | Status: DC
  Administered 2023-08-27 – 2023-08-29 (×4): 20 [IU] via SUBCUTANEOUS
  Filled 2023-08-27 (×4): qty 0.2

## 2023-08-27 MED ORDER — OSMOLITE 1.2 CAL PO LIQD
1000.0000 mL | ORAL | Status: DC
Start: 2023-08-27 — End: 2023-08-28
  Administered 2023-08-27: 1000 mL

## 2023-08-27 MED ORDER — SODIUM CHLORIDE 0.9 % IV SOLN
2.0000 g | Freq: Three times a day (TID) | INTRAVENOUS | Status: DC
  Administered 2023-08-27: 2 g via INTRAVENOUS
  Filled 2023-08-27: qty 12.5

## 2023-08-27 MED ORDER — VANCOMYCIN HCL 1500 MG/300ML IV SOLN
1500.0000 mg | INTRAVENOUS | Status: DC
  Administered 2023-08-27 – 2023-08-28 (×2): 1500 mg via INTRAVENOUS
  Filled 2023-08-27 (×2): qty 300

## 2023-08-27 NOTE — Assessment & Plan Note (Signed)
 Continue Keppra via G-tube

## 2023-08-27 NOTE — ED Notes (Signed)
 Pt was given a bed bath, gown changed, linen changed, new brief placed, foley re-secured to body. Pt turned to R side, foley emptied

## 2023-08-27 NOTE — ED Notes (Signed)
 Pt given a complete bed/gown change. Sacral pad placed. Pillows placed under pt's left side.

## 2023-08-27 NOTE — Assessment & Plan Note (Addendum)
 Gross hematuria Etiology uncertain but suspecting GU given bladder wall thickening and gross blood seen at meatus-radiology states underlying bladder lesion cannot be totally excluded Sepsis criteria include fever, tachycardia and tachypnea with hypoxia, leukocytosis and lactic acidosis Continue cefepime Sepsis fluids Follow UA--Foley was placed in the ED but minimal urine output Consider urology consult

## 2023-08-27 NOTE — Plan of Care (Signed)
 Patient seen and examined this morning.  Vital labs and imaging reviewed.  Was tachycardic which improved.  Blood cultures positive for Staph epidermidis.  Patient was started on vancomycin.  Continues to stay nonverbal with no clear baseline.   - Rest of the plan as detailed in H&P.

## 2023-08-27 NOTE — H&P (Signed)
 History and Physical    Patient: Evan Good:[id number] DOB: 31-Aug-1970 DOA: 08/26/2023 DOS: the patient was seen and examined on 08/27/2023 PCP: Patrecia Pour, MD  Patient coming from: Home  Chief Complaint:  Chief Complaint  Patient presents with   Fever   Emesis    HPI: Evan Good is a 53 y.o. 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 ED course and data review: Tmax 102.1 and tachycardic to 126, tachypneic to 21 with O2 sat 88% on room air improving to low 90s on 2 L Labs notable for WBC 19,000 with lactic acid 3.7 Respiratory viral panel negative Urinalysis pending Glucose 658 with normal anion gap, potassium 5.5, creatinine 1.97 most recent baseline 0.71 from a couple years prior EKG not done CT abdomen and pelvis showing considerable bladder wall thickening related to decompression likely, underlying bladder lesion cannot be totally excluded Chest x-ray no active disease Patient treated with sepsis fluids, given insulin 10 units IV and started on Maxipime and vancomycin  Patient was noted to have blood at the meatus on physical exam  Hospitalist consulted for admission.     Past Medical History:  Diagnosis Date   COPD (chronic obstructive pulmonary disease) (HCC)    Diabetes mellitus without complication (HCC)    Seizures (HCC)    History reviewed. No pertinent surgical history. Social History:  reports that he has never smoked. He has never used smokeless tobacco. He reports that he does not currently use alcohol. He reports that he does not currently use  drugs.  No Known Allergies  History reviewed. No pertinent family history.  Prior to Admission medications   Medication Sig Start Date End Date Taking? Authorizing Provider  chlorhexidine (PERIDEX) 0.12 % solution 5 mL by Mouth route two (2) times a day. 10/17/20   [provider]  Cholecalciferol 25 MCG (1000 UT) tablet 1,000 Units daily. Via g-tube 10/17/20   [provider]  famotidine (PEPCID) 20 MG tablet 20 mg daily. Via g-tube 10/18/20   [provider]  folic acid (FOLVITE) 1 MG tablet 1 mg daily. Via g-tube 02/25/20   [provider]  HYDROcodone-acetaminophen (NORCO/VICODIN) 5-325 MG tablet Take 1 tablet by mouth every 12 (twelve) hours as needed for moderate pain or severe pain. 12/18/20   Danford, Earl Lites, MD  insulin aspart (NOVOLOG) 100 UNIT/ML injection Inject 0-10 Units into the skin 3 (three) times daily before meals.    [provider]  insulin glargine (LANTUS) 100 UNIT/ML injection Inject 0.25 mLs (25 Units total) into the skin daily. 10/31/20   Enedina Finner, MD  levETIRAcetam (KEPPRA) 1000 MG tablet 1 tablet (1,000 mg total) by J-Tube route Two (2) times a day. 02/25/20   [provider]  melatonin 3 MG TABS tablet 3 mg at bedtime. 04/05/19   [provider]  Multiple Vitamins-Minerals (SUPER THERA VITE M) TABS Take 1 tablet by mouth daily. Via g-tube 03/20/19   [provider]  Nutritional Supplements (FEEDING SUPPLEMENT, OSMOLITE 1.5 CAL,) LIQD Place 1,000 mLs into feeding tube continuous. 10/30/20   Enedina Finner, MD  Nutritional Supplements (FEEDING SUPPLEMENT, PROSOURCE TF,) liquid Place 45 mLs into feeding tube 2 (two) times daily.  10/30/20   Enedina Finner, MD  polyethylene glycol powder (GLYCOLAX/MIRALAX) 17 GM/SCOOP powder 17 g by G-tube route two (2) times a day as needed (give throught g-tube). 10/17/20   [provider]  thiamine 100 MG tablet 100 mg daily. Via g-tube 02/25/20   [provider]  vitamin B-12 (CYANOCOBALAMIN) 1000 MCG tablet Place 1 tablet (1,000 mcg total) into feeding tube daily. 10/30/20   Enedina Finner, MD    Physical Exam: Vitals:   08/26/23 2200 08/26/23 2256 08/27/23 0030 08/27/23 0100  BP: 121/70 125/74 107/79 118/86  Pulse: (!) 110 65 (!) 128 (!) 121  Resp: 20 18 (!) 21 (!) 21  Temp:      TempSrc:      SpO2: 92% 92% 92% 91%  Weight:      Height:       Physical Exam Vitals and nursing note reviewed.  Constitutional:      General: He is not in acute distress.    Comments: Nonverbal, not interacting.  Appears unaware of surroundings.  Baseline uncertain  HENT:     Head: Normocephalic and atraumatic.  Cardiovascular:     Rate and Rhythm: Regular rhythm. Tachycardia present.     Heart sounds: Normal heart sounds.  Pulmonary:     Effort: Pulmonary effort is normal.     Breath sounds: Normal breath sounds.  Abdominal:     Palpations: Abdomen is soft.     Tenderness: There is no abdominal tenderness.  Musculoskeletal:     Comments: Disuse atrophy extremities     Labs on Admission: I have personally reviewed following labs and imaging studies  CBC: Recent Labs  Lab 08/26/23 1737  WBC 19.3*  NEUTROABS 13.5*  HGB 13.6  HCT 45.6  MCV 91.8  PLT 249   Basic Metabolic Panel: Recent Labs  Lab 08/26/23 1737  NA 149*  K 5.5*  CL 109  CO2 27  GLUCOSE 658*  BUN 119*  CREATININE 1.97*  CALCIUM 9.3   GFR: Estimated Creatinine Clearance: 45 mL/min (A) (by C-G formula based on SCr of 1.97 mg/dL (H)). Liver Function Tests: Recent Labs  Lab 08/26/23 1737  AST 32  ALT 63*  ALKPHOS 131*  BILITOT 0.6  PROT 8.2*  ALBUMIN 3.1*   No results for input(s): "LIPASE", "AMYLASE" in the last 168 hours. No results for input(s): "AMMONIA" in the last 168 hours. Coagulation Profile: Recent Labs  Lab 08/26/23 1737  INR 1.2   Cardiac Enzymes: No results for input(s): "CKTOTAL", "CKMB", "CKMBINDEX", "TROPONINI" in the last 168 hours. BNP  (last 3 results) No results for input(s): "PROBNP" in the last 8760 hours. HbA1C: No results for input(s): "HGBA1C" in the last 72 hours. CBG: Recent Labs  Lab 08/26/23 1718 08/26/23 2045 08/26/23 2244 08/26/23 2323  GLUCAP 521* 550* 505* 426*   Lipid Profile: No results for input(s): "CHOL", "HDL", "LDLCALC", "TRIG", "CHOLHDL", "LDLDIRECT" in the last 72 hours. Thyroid Function Tests: No results for input(s): "TSH", "T4TOTAL", "FREET4", "T3FREE", "THYROIDAB" in the last 72 hours. Anemia Panel: No results for input(s): "VITAMINB12", "FOLATE", "FERRITIN", "TIBC", "IRON", "RETICCTPCT" in the last 72 hours. Urine analysis:    Component Value Date/Time   COLORURINE YELLOW (A) 10/26/2020 1220   APPEARANCEUR CLOUDY (A) 10/26/2020 1220   LABSPEC 1.015 10/26/2020 1220   PHURINE 8.0 10/26/2020 1220   GLUCOSEU NEGATIVE 10/26/2020 1220   HGBUR MODERATE (A) 10/26/2020 1220   BILIRUBINUR NEGATIVE 10/26/2020 1220   KETONESUR NEGATIVE 10/26/2020 1220   PROTEINUR 30 (A)  10/26/2020 1220   NITRITE NEGATIVE 10/26/2020 1220   LEUKOCYTESUR LARGE (A) 10/26/2020 1220    Radiological Exams on Admission: CT ABDOMEN PELVIS W CONTRAST Result Date: 08/26/2023 CLINICAL DATA:  Abdominal pain EXAM: CT ABDOMEN AND PELVIS WITH CONTRAST TECHNIQUE: Multidetector CT imaging of the abdomen and pelvis was performed using the standard protocol following bolus administration of intravenous contrast. RADIATION DOSE REDUCTION: This exam was performed according to the departmental dose-optimization program which includes automated exposure control, adjustment of the mA and/or kV according to patient size and/or use of iterative reconstruction technique. CONTRAST:  80mL OMNIPAQUE IOHEXOL 300 MG/ML  SOLN COMPARISON:  07/19/2022 FINDINGS: Lower chest: Mild left basilar atelectasis is noted. Hepatobiliary: Liver is within normal limits. Gallbladder is decompressed and not well visualized. Pancreas: Unremarkable. No pancreatic  ductal dilatation or surrounding inflammatory changes. Spleen: Normal in size without focal abnormality. Adrenals/Urinary Tract: Adrenal glands are within normal limits. Kidneys demonstrate a normal enhancement pattern. No renal calculi or obstructive changes are seen. Normal excretion is noted on delayed images. The bladder is decompressed with considerable wall thickening. This is of uncertain significance given the decompression. Bladder lesion could not be totally excluded on the basis of this exam. Stomach/Bowel: No obstructive or inflammatory changes of colon are noted. Retained fecal material is seen without obstruction. The appendix is within normal limits. Jejunostomy catheter is noted in place. No obstructive changes of the small bowel are noted. Postsurgical changes in the stomach are noted. Vascular/Lymphatic: Aortic atherosclerosis. No enlarged abdominal or pelvic lymph nodes. Reproductive: Prostate is unremarkable. Other: No abdominal wall hernia or abnormality. No abdominopelvic ascites. Musculoskeletal: No acute or significant osseous findings. IMPRESSION: Considerable bladder wall thickening which may be simply related to decompression. Given the history of blood at the penile meatus, underlying bladder lesion cannot be totally excluded. Mild left basilar atelectasis. Electronically Signed   By: Alcide Clever M.D.   On: 08/26/2023 22:20   DG Chest Port 1 View Result Date: 08/26/2023 CLINICAL DATA:  [id number] Sepsis [facility]) [id number] EXAM: PORTABLE CHEST 1 VIEW COMPARISON:  Chest x-ray 12/16/2020 FINDINGS: The heart and mediastinal contours are within normal limits. Low lung volumes. No focal consolidation. No pulmonary edema. No pleural effusion. No pneumothorax. No acute osseous abnormality. IMPRESSION: Low lung volumes with no active disease. Electronically Signed   By: Tish Frederickson M.D.   On: 08/26/2023 19:37     Data Reviewed: Relevant notes from primary care and specialist visits, past  discharge summaries as available in EHR, including Care Everywhere. Prior diagnostic testing as pertinent to current admission diagnoses Updated medications and problem lists for reconciliation ED course, including vitals, labs, imaging, treatment and response to treatment Triage notes, nursing and pharmacy notes and ED provider's notes Notable results as noted in HPI   Assessment and Plan: * Severe sepsis (HCC) Gross hematuria Etiology uncertain but suspecting GU given bladder wall thickening and gross blood seen at meatus-radiology states underlying bladder lesion cannot be totally excluded Sepsis criteria include fever, tachycardia and tachypnea with hypoxia, leukocytosis and lactic acidosis Continue cefepime Sepsis fluids Follow UA--Foley was placed in the ED but minimal urine output Consider urology consult  AKI (acute kidney injury) (HCC) Secondary to sepsis Expecting improvement to baseline with IV fluid resuscitation  Acute metabolic encephalopathy Neurologic checks with fall and aspiration precautions  Uncontrolled type 2 diabetes mellitus with hyperglycemia, with long-term current use of insulin (HCC) Basal insulin with sliding scale coverage Received IV insulin with a fluid bolus in the ED with improvement  in blood sugar from 658 to the 400s  Electrolyte abnormality Hypernatremia and hyperkalemia Monitor for improvement with hydration and correct accordingly  Gastrostomy status (HCC) Gastrostomy care  Seizure disorder (HCC) Continue Keppra via G-tube  History of anoxic brain injury Functional quadriplegia Supportive care     DVT prophylaxis: Lovenox  Consults: none  Advance Care Planning:   Code Status: Prior   Family Communication: none  Disposition Plan: Back to previous home environment  Severity of Illness: The appropriate patient status for this patient is INPATIENT. Inpatient status is judged to be reasonable and necessary in order to provide  the required intensity of service to ensure the patient's safety. The patient's presenting symptoms, physical exam findings, and initial radiographic and laboratory data in the context of their chronic comorbidities is felt to place them at high risk for further clinical deterioration. Furthermore, it is not anticipated that the patient will be medically stable for discharge from the hospital within 2 midnights of admission.   * I certify that at the point of admission it is my clinical judgment that the patient will require inpatient hospital care spanning beyond 2 midnights from the point of admission due to high intensity of service, high risk for further deterioration and high frequency of surveillance required.*  Author: Andris Baumann, MD 08/27/2023 1:13 AM  For on call review [email or web address].

## 2023-08-27 NOTE — Assessment & Plan Note (Signed)
 Secondary to sepsis Expecting improvement to baseline with IV fluid resuscitation

## 2023-08-27 NOTE — ED Notes (Signed)
 Called pharmacy to readjust insulin times @this  time

## 2023-08-27 NOTE — Assessment & Plan Note (Signed)
 Basal insulin with sliding scale coverage Received IV insulin with a fluid bolus in the ED with improvement in blood sugar from 658 to the 400s

## 2023-08-27 NOTE — ED Notes (Signed)
 Bladder scan showed .

## 2023-08-27 NOTE — Assessment & Plan Note (Signed)
 Gastrostomy care

## 2023-08-27 NOTE — Assessment & Plan Note (Signed)
 Neurologic checks with fall and aspiration precautions

## 2023-08-27 NOTE — Assessment & Plan Note (Signed)
 Functional quadriplegia Supportive care

## 2023-08-27 NOTE — Assessment & Plan Note (Signed)
 Hypernatremia and hyperkalemia Monitor for improvement with hydration and correct accordingly

## 2023-08-27 NOTE — ED Notes (Signed)
 G tube flushed with 30 ml of water before initiating tube feeding at 33ml/hr.

## 2023-08-27 NOTE — Progress Notes (Signed)
 Pharmacy Antibiotic Note  Evan Good is a 53 y.o. male admitted on 08/26/2023 with fever and emesis. Bcx 4 of 4 positive for GPC. BCID identified Staph epi, MecA+. Patient has PMH of anoxic brain injury, HFrEF, Seizures, and DM.   Pharmacy has been consulted for vancomycin dosing. Patient received vancomycin 1g IV 2/14 @ 2023.  Plan: Start Vancomycin 1500 mg IV Q 24 hrs. Goal AUC 400-550. Expected AUC: 516 SCr used: 1.44  Pharmacy will continue to monitor renal function and adjust dose as needed.   Height: 5\' 11"  (180.3 cm) Weight: 72.6 kg (160 lb) IBW/kg (Calculated) : 75.3  Temp (24hrs), Avg:99.7 F (37.6 C), Min:98.4 F (36.9 C), Max:102.1 F (38.9 C)  Recent Labs  Lab 08/26/23 1737 08/26/23 2048 08/27/23 0239  WBC 19.3*  --   --   CREATININE 1.97*  --  1.44*  LATICACIDVEN 4.5* 3.7*  --     Estimated Creatinine Clearance: 61.6 mL/min (A) (by C-G formula based on SCr of 1.44 mg/dL (H)).    No Known Allergies  Antimicrobials this admission: 2/14 cefepime >> 2/15 2/14 Vancomycin >>   Dose adjustments this admission: N/A  Microbiology results: 2/14 BCx: 4 of 4 GPC, BCID: Staph epi, MecA (+) 2/15 UCx: pending   Thank you for allowing pharmacy to be a part of this patient's care.  Gardner Candle, PharmD, BCPS Clinical Pharmacist 08/27/2023 12:23 PM

## 2023-08-28 ENCOUNTER — Inpatient Hospital Stay
Admit: 2023-08-28 | Discharge: 2023-08-28 | Disposition: A | Discharge status: Home / Self Care | Payer: Medicare Other | Attending: Internal Medicine | Admitting: Internal Medicine

## 2023-08-28 DIAGNOSIS — R7881 Bacteremia: Secondary | ICD-10-CM | POA: Diagnosis not present

## 2023-08-28 DIAGNOSIS — R6520 Severe sepsis without septic shock: Secondary | ICD-10-CM | POA: Diagnosis not present

## 2023-08-28 DIAGNOSIS — A419 Sepsis, unspecified organism: Secondary | ICD-10-CM | POA: Diagnosis not present

## 2023-08-28 LAB — COMPREHENSIVE METABOLIC PANEL
ALT: 34 U/L (ref 0–44)
AST: 15 U/L (ref 15–41)
Albumin: 2.5 g/dL — ABNORMAL LOW (ref 3.5–5.0)
Alkaline Phosphatase: 94 U/L (ref 38–126)
Anion gap: 9 (ref 5–15)
BUN: 57 mg/dL — ABNORMAL HIGH (ref 6–20)
CO2: 26 mmol/L (ref 22–32)
Calcium: 8.9 mg/dL (ref 8.9–10.3)
Chloride: 118 mmol/L — ABNORMAL HIGH (ref 98–111)
Creatinine, Ser: 1.06 mg/dL (ref 0.61–1.24)
GFR, Estimated: >60 mL/min (ref >60)
Glucose, Bld: 352 mg/dL — ABNORMAL HIGH (ref 70–99)
Potassium: 4.4 mmol/L (ref 3.5–5.1)
Sodium: 153 mmol/L — ABNORMAL HIGH (ref 135–145)
Total Bilirubin: 0.7 mg/dL (ref 0.0–1.2)
Total Protein: 6.4 g/dL — ABNORMAL LOW (ref 6.5–8.1)

## 2023-08-28 LAB — LACTIC ACID, PLASMA: Lactic Acid, Venous: 2.1 mmol/L (ref 0.5–1.9)

## 2023-08-28 LAB — CBC
HCT: 41.7 % (ref 39.0–52.0)
Hemoglobin: 12.4 g/dL — ABNORMAL LOW (ref 13.0–17.0)
MCH: 27.3 pg (ref 26.0–34.0)
MCHC: 29.7 g/dL — ABNORMAL LOW (ref 30.0–36.0)
MCV: 91.9 fL (ref 80.0–100.0)
Platelets: 156 10*3/uL (ref 150–400)
RBC: 4.54 MIL/uL (ref 4.22–5.81)
RDW: 14.2 % (ref 11.5–15.5)
WBC: 7.5 10*3/uL (ref 4.0–10.5)
nRBC: 0.3 % — ABNORMAL HIGH (ref 0.0–0.2)

## 2023-08-28 LAB — ECHOCARDIOGRAM COMPLETE
AR max vel: 3.91 cm2
AV Peak grad: 3.7 mm[Hg]
Ao pk vel: 0.96 m/s
Area-P 1/2: 6.83 cm2
Height: 71 in
S' Lateral: 2.8 cm
Weight: 2560 [oz_av]

## 2023-08-28 LAB — GLUCOSE, CAPILLARY
Glucose-Capillary: 253 mg/dL — ABNORMAL HIGH (ref 70–99)
Glucose-Capillary: 280 mg/dL — ABNORMAL HIGH (ref 70–99)
Glucose-Capillary: 294 mg/dL — ABNORMAL HIGH (ref 70–99)
Glucose-Capillary: 288 mg/dL — ABNORMAL HIGH (ref 70–99)
Glucose-Capillary: 295 mg/dL — ABNORMAL HIGH (ref 70–99)
Glucose-Capillary: 266 mg/dL — ABNORMAL HIGH (ref 70–99)

## 2023-08-28 LAB — URINE CULTURE: Culture: NO GROWTH

## 2023-08-28 MED ORDER — CALCIUM CARBONATE ANTACID 500 MG PO CHEW
1.0000 | CHEWABLE_TABLET | Freq: Three times a day (TID) | ORAL | Status: DC
  Administered 2023-08-28 – 2023-09-01 (×12): 200 mg
  Filled 2023-08-28 (×12): qty 1

## 2023-08-28 MED ORDER — ADULT MULTIVITAMIN LIQUID CH
15.0000 mL | Freq: Two times a day (BID) | ORAL | Status: DC
  Administered 2023-08-29 – 2023-08-31 (×5): 15 mL via ORAL
  Filled 2023-08-28 (×6): qty 15

## 2023-08-28 MED ORDER — GLUCERNA 1.5 CAL PO LIQD
1000.0000 mL | ORAL | Status: DC
  Administered 2023-08-28: 1000 mL

## 2023-08-28 MED ORDER — VANCOMYCIN HCL IN DEXTROSE 1-5 GM/200ML-% IV SOLN: 1000.0000 mg | Freq: Two times a day (BID) | INTRAVENOUS | Status: DC

## 2023-08-28 MED ORDER — ADULT MULTIVITAMIN W/MINERALS CH
1.0000 | ORAL_TABLET | Freq: Two times a day (BID) | ORAL | Status: DC
  Administered 2023-08-28: 1
  Filled 2023-08-28: qty 1

## 2023-08-28 MED ORDER — SODIUM CHLORIDE 0.9 % IV SOLN: INTRAVENOUS | Status: AC

## 2023-08-28 NOTE — Progress Notes (Signed)
 Pharmacy Antibiotic Note  Evan Good is a 53 y.o. adult admitted on 08/26/2023 with fever and emesis. Bcx 4 of 4 positive for GPC. BCID identified Staph epi, MecA+. Patient has PMH of anoxic brain injury, HFrEF, Seizures, and DM.   Pharmacy has been consulted for vancomycin dosing. Patient received vancomycin 1g IV 2/14 @ 2023.  Plan: Improvement in Scr. Will adjust vancomycin dose to 1000 mg q12H. Predicted AUC of 517. Goal AUC of 400-600. Vd 0.72, Scr 1.06, TBW. Plan to obtain vancomycin levels after 4th or 5th dose.   Pharmacy will continue to monitor renal function and adjust dose as needed.   Height: 5\' 11"  (180.3 cm) Weight: 72.6 kg (160 lb) IBW/kg (Calculated) : 75.3  Temp (24hrs), Avg:98.2 F (36.8 C), Min:97.9 F (36.6 C), Max:98.8 F (37.1 C)  Recent Labs  Lab 08/26/23 1737 08/26/23 2048 08/27/23 0239 08/27/23 1613 08/28/23 0955  WBC 19.3*  --   --   --  7.5  CREATININE 1.97*  --  1.44* 1.11 1.06  LATICACIDVEN 4.5* 3.7*  --   --  2.1*    Estimated Creatinine Clearance (by C-G formula based on SCr of 1.06 mg/dL) Male: 46.9 mL/min Male: 83.7 mL/min    No Known Allergies  Antimicrobials this admission: 2/14 cefepime >> 2/15 2/14 Vancomycin >>   Dose adjustments this admission: N/A  Microbiology results: 2/14 BCx: 4 of 4 GPC, BCID: Staph epi, MecA (+) 2/15 UCx: pending   Thank you for allowing pharmacy to be a part of this patient's care.  Ronnald Ramp, PharmD, BCPS Clinical Pharmacist 08/28/2023 1:21 PM

## 2023-08-28 NOTE — Progress Notes (Signed)
 Progress Note   Patient: Evan Good VHQ:[id number] DOB: 03/27/1971 DOA: 08/26/2023     1 DOS: the patient was seen and examined on 08/28/2023   Brief hospital course:  53 y.o. 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  ED course and data review: Tmax 102.1 and tachycardic to 126, tachypneic to 21 with O2 sat 88% on room air improving to low 90s on 2 L Labs notable for WBC 19,000 with lactic acid 3.7 Respiratory viral panel negative Glucose 658 with normal anion gap, potassium 5.5, creatinine 1.97 most recent baseline 0.71 from a couple years prior CT abdomen and pelvis showing considerable bladder wall thickening related to decompression likely, underlying bladder lesion cannot be totally excluded Chest x-ray no active disease Patient treated with sepsis fluids, given insulin 10 units IV and started on Maxipime and vancomycin Patient was noted to have blood at the meatus on physical exam   2/16  : Urine cultures were negative, empiric antibiotics for UTI was stopped.  Patient however had bacteremia positive for staph epi was started on vancomycin.  Assessment and Plan: Severe sepsis  / Gross hematuria  Etiology uncertain but suspecting GU given bladder wall thickening and gross blood seen at meatus-radiology states underlying bladder lesion cannot be totally excluded Sepsis criteria include fever, tachycardia and tachypnea with hypoxia, leukocytosis and lactic acidosis Received cefepime for 1 day which was discontinued later on as UTI was ruled out.  Patient however was bacteremia started on vancomycin. Received  sepsis protocol fluids in the ED at the time of presentation   AKI (acute kidney injury) -improving with creatinine of 1.06 however now with hyponatremia 153 Secondary to sepsis Continue with IV fluid normal saline at 100 cc/h.    Acute metabolic encephalopathy Neurologic checks with fall and aspiration precautions  Uncontrolled type 2 diabetes mellitus with hyperglycemia, with long-term current use of insulin  Basal insulin with sliding scale coverage Received IV insulin with a fluid bolus in the ED with improvement in blood sugar from 658 to the 400s  Electrolyte abnormality Hypernatremia and hyperkalemia Monitor for improvement with hydration and correct accordingly -Continue with free water 100 cc flushes every 4 hourly.  Gastrostomy status  Gastrostomy care  Seizure disorder  Continue Keppra via G-tube  History of anoxic brain injury Functional quadriplegia Supportive care     Subjective: Patient seen and examined this morning.  Nasal cannula on his forehead.  Saturating normal without oxygen.  No overnight event as per nursing.  Vital labs and imaging reviewed.  Physical Exam: Vitals:   08/27/23 1955 08/28/23 0049 08/28/23 0443 08/28/23 0743  BP: 100/70 110/74 108/75 113/66  Pulse: 63 85 84 (!) 116  Resp: 18 18  16   Temp: 97.9 F (36.6 C)  98.8 F (37.1 C) 98.2 F (36.8 C)  TempSrc:      SpO2: 97% 94% 97%   Weight:      Height:       Physical Exam Constitutional:      General: He is not in acute distress.    Appearance: He is ill-appearing. He is not toxic-appearing.  HENT:     Head: Normocephalic and atraumatic.     Nose: No congestion.  Eyes:     Extraocular Movements: Extraocular movements intact.     Pupils: Pupils are equal, round, and reactive to light.  Cardiovascular:     Rate and Rhythm: Normal rate.  Pulmonary:     Effort: No respiratory distress.     Breath sounds: No stridor.  Abdominal:     Palpations: Abdomen is soft.   Genitourinary:    Comments: Foley in place.  Clear urine no blood Musculoskeletal:     Cervical back: Neck supple. No tenderness.     Comments: Left upper extremity in contracted state, left lower extremity no strength.  Patient has quadriplegia. Skin:    General: Skin is warm and dry.  Neurological:     Comments: Quadriplegic no new focal deficit    Data Reviewed:  Results are pending, will review when available.  Family Communication: None by bedside  Disposition: Status is: Inpatient Remains inpatient appropriate because: Bacteremia  Planned Discharge Destination: Skilled nursing facility    Time spent: 36 minutes  Author: Kirstie Peri, MD 08/28/2023 2:52 PM  For on call review [email or web address].

## 2023-08-28 NOTE — Consult Note (Signed)
 Requested images of LE wounds Please note that the WOC nursing team is utilizing a standardized work plan to manage patient consults. We are triaging consults and will try to see the patients within 48 hours. Wound photos in the patient's chart allow [place] to consult on the patient in the most efficient and timely manner.

## 2023-08-28 NOTE — Inpatient Diabetes Management (Signed)
 Inpatient Diabetes Program Recommendations  AACE/ADA: New Consensus Statement on Inpatient Glycemic Control (2015)  Target Ranges:  Prepandial:   less than 140 mg/dL      Peak postprandial:   less than 180 mg/dL (1-2 hours)      Critically ill patients:  140 - 180 mg/dL   Lab Results  Component Value Date   GLUCAP 288 (H) 08/28/2023   HGBA1C 9.9 (H) 08/27/2023    Review of Glycemic Control  Latest Reference Range & Units 08/28/23 02:56 08/28/23 06:27 08/28/23 08:21 08/28/23 12:04  Glucose-Capillary 70 - 99 mg/dL 960 (H) 454 (H) 098 (H) 288 (H)  (H): Data is abnormally high  Diabetes history: DM2 Outpatient Diabetes medications: Lanyus 14 units BID, Novolog SSI TID (was changed from Fiasp 5 units TID on 08/20/23) Current orders for Inpatient glycemic control: Semglee 20 units BID, Novolog 0-9 units TID   Received page for hyperglycemia, NPO with continuous tube feeds.  Tube feeds being changed from Osmolite to Glucerna with a goal of 55 ml/hr. @ 17:00 this evening.  If glucose trends remain elevated after switching to Glucerna, please consider:  Novolog 3 units TID tube feed coverage (hold if feeds are held or discontinued).  Will continue to follow while inpatient.  Thank you, Dulce Sellar, MSN, CDCES Diabetes Coordinator Inpatient Diabetes Program [phone] (team pager from 8a-5p)

## 2023-08-28 NOTE — Progress Notes (Signed)
  Echocardiogram 2D Echocardiogram has been performed.  Evan Good 08/28/2023, 3:04 PM

## 2023-08-28 NOTE — Progress Notes (Signed)
 Initial Nutrition Assessment  DOCUMENTATION CODES:   Not applicable  INTERVENTION:   -TF via j-tube:   -D/c Osmolite 1.2 and Osmolite 1.5  Initiate Glucerna 1.5 @ 40 ml/hr and increase by 10 ml every 4 hours to goal rate of 55 ml/hr.   100 ml free water flush every 4 hours  Tube feeding regimen provides 1980 kcal (100% of needs), 109 grams of protein, and 1002 ml of H2O. Total free water: 1602 ml daily  -MVI with minerals BID via tube -500 mg calcium carbonate TID via tube -RD sent secure chat to DM coordinator to discuss insulin needs with hyperglycemia, poorly controlled DM PTA, and continuous TF  NUTRITION DIAGNOSIS:   Inadequate oral intake related to inability to eat as evidenced by NPO status.  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  Pt admitted with severe sepsis and gross hematuria.   Reviewed I/O's: -2.3 L x 24 hours  UOP: 2.3 L x 24 hours  Pt unavailable at time of visit. Attempted to speak with pt via call to hospital room phone, however, unable to reach. RD unable to obtain further nutrition-related history or complete nutrition-focused physical exam at this time.    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   Pt is NPO secondary to anoxic brain injury thought to be secondary to nutritional deficiencies from roux en y gastric bypass surgery. Pt is j-tube dependent and receive sole source  nutrition via j-tube. RD reviewed chart and pt was identified with moderate malnutrition on prior admission. Suspect this is still ongoing, however, unable to identify at this time. Unsure of home TF regimen, however, per last hospitalization at [facility] in 2021, pt was discharge on Nepro @ 50 ml/hr with 60 ml free water flush every 6 hours.   Pt currently receiving Osmolite 1.2 @ 40 ml/hr via j-tube with 100 ml free water flush every 4 hours.  Reviewed wt hx; wt has been stable over the past year.Per nursing assessment, pt with mild edema, which may be masking true weight loss as well as possible fat and muscle depletions.   Medications reviewed and include keppra, 0.9% sodium chloride infusion @ 100 ml/hr,  and protonix.  Lab Results  Component Value Date   HGBA1C 9.9 (H) 08/27/2023   PTA DM medications are  0-10 units insulin aspart TID and 25 units insulin glargine daily. Pt with uncontrolled DM and suspect insulin regimen inadequate PTA.   Labs reviewed: Na: 153, Mg: 2.5, K and Phos WDL. CBGS: 288-294 (inpatient orders for glycemic control are 0-9 units insulin aspart every 4 hours and 20 units insulin glargine-yfgn daily).    Diet Order:   Diet Order     None       EDUCATION NEEDS:   No education needs have been identified at this time  Skin:  Skin Assessment: Skin Integrity Issues: Skin Integrity Issues:: Other (Comment) Other: chronic HS to groin axilla/ sacrum  Last BM:  08/26/23  Height:  Ht Readings from Last 1 Encounters:  08/26/23 5\' 11"  (1.803 m)    Weight:   Wt Readings from Last 1 Encounters:  08/26/23 72.6 kg    Ideal Body Weight:  78.2 kg  BMI:  Body mass index is 22.32 kg/m.  Estimated Nutritional Needs:   Kcal:  1850-2050  Protein:  90-105 grams  Fluid:  > 1.8 L    Levada Schilling, RD, LDN, CDCES Registered Dietitian III Certified Diabetes Care and Education Specialist If unable to reach this RD, please use "RD Inpatient" group chat on secure  chat between hours of 8am-4 pm daily

## 2023-08-28 NOTE — Consult Note (Signed)
 WOC Nurse Consult Note: Reason for Consult: bilateral groin, axilla BLE wounds Wound type: groin and axilla/scrotum related to chronic HS Pressure Injury POA: NA Measurement: see nursing flow sheets Wound bed:see nursing flow sheets Drainage (amount, consistency, odor) none documented  Periwound:intact Dressing procedure/placement/frequency: Cleanse above noted areas with soap and water, if draining use silver hydrofiber and ABD pads, change daily.    Re consult if needed, will not follow at this time. Thanks  Neema Fl[employer]ge [employer], RN,CWOCN, CNS, CWON-AP [phone])

## 2023-08-29 DIAGNOSIS — R509 Fever, unspecified: Secondary | ICD-10-CM

## 2023-08-29 DIAGNOSIS — Z8669 Personal history of other diseases of the nervous system and sense organs: Secondary | ICD-10-CM

## 2023-08-29 DIAGNOSIS — R319 Hematuria, unspecified: Secondary | ICD-10-CM

## 2023-08-29 DIAGNOSIS — N390 Urinary tract infection, site not specified: Secondary | ICD-10-CM | POA: Diagnosis not present

## 2023-08-29 DIAGNOSIS — R532 Functional quadriplegia: Secondary | ICD-10-CM

## 2023-08-29 DIAGNOSIS — R6520 Severe sepsis without septic shock: Secondary | ICD-10-CM | POA: Diagnosis not present

## 2023-08-29 DIAGNOSIS — A419 Sepsis, unspecified organism: Secondary | ICD-10-CM | POA: Diagnosis not present

## 2023-08-29 LAB — COMPREHENSIVE METABOLIC PANEL
ALT: 29 U/L (ref 0–44)
AST: 19 U/L (ref 15–41)
Albumin: 2.4 g/dL — ABNORMAL LOW (ref 3.5–5.0)
Alkaline Phosphatase: 114 U/L (ref 38–126)
Anion gap: 7 (ref 5–15)
BUN: 42 mg/dL — ABNORMAL HIGH (ref 6–20)
CO2: 25 mmol/L (ref 22–32)
Calcium: 8.3 mg/dL — ABNORMAL LOW (ref 8.9–10.3)
Chloride: 120 mmol/L — ABNORMAL HIGH (ref 98–111)
Creatinine, Ser: 0.85 mg/dL (ref 0.61–1.24)
GFR, Estimated: >60 mL/min (ref >60)
Glucose, Bld: 332 mg/dL — ABNORMAL HIGH (ref 70–99)
Potassium: 4.2 mmol/L (ref 3.5–5.1)
Sodium: 152 mmol/L — ABNORMAL HIGH (ref 135–145)
Total Bilirubin: 0.5 mg/dL (ref 0.0–1.2)
Total Protein: 6.6 g/dL (ref 6.5–8.1)

## 2023-08-29 LAB — GLUCOSE, CAPILLARY
Glucose-Capillary: 251 mg/dL — ABNORMAL HIGH (ref 70–99)
Glucose-Capillary: 259 mg/dL — ABNORMAL HIGH (ref 70–99)
Glucose-Capillary: 281 mg/dL — ABNORMAL HIGH (ref 70–99)
Glucose-Capillary: 287 mg/dL — ABNORMAL HIGH (ref 70–99)
Glucose-Capillary: 303 mg/dL — ABNORMAL HIGH (ref 70–99)
Glucose-Capillary: 315 mg/dL — ABNORMAL HIGH (ref 70–99)
Glucose-Capillary: 339 mg/dL — ABNORMAL HIGH (ref 70–99)
Glucose-Capillary: 348 mg/dL — ABNORMAL HIGH (ref 70–99)

## 2023-08-29 LAB — MAGNESIUM: Magnesium: 2.3 mg/dL (ref 1.7–2.4)

## 2023-08-29 MED ORDER — SODIUM CHLORIDE 0.9 % IV SOLN
2.0000 g | INTRAVENOUS | Status: DC
  Administered 2023-08-29 – 2023-08-31 (×3): 2 g via INTRAVENOUS
  Filled 2023-08-29 (×4): qty 20

## 2023-08-29 MED ORDER — JUVEN PO PACK
1.0000 | PACK | Freq: Two times a day (BID) | ORAL | Status: DC
  Administered 2023-08-29 – 2023-09-01 (×7): 1

## 2023-08-29 MED ORDER — SELENIUM SULFIDE 1 % EX LOTN
TOPICAL_LOTION | Freq: Every day | CUTANEOUS | Status: DC
  Filled 2023-08-29: qty 207

## 2023-08-29 MED ORDER — INSULIN ASPART 100 UNIT/ML IJ SOLN
4.0000 [IU] | INTRAMUSCULAR | Status: DC
  Administered 2023-08-29 – 2023-09-01 (×19): 4 [IU] via SUBCUTANEOUS
  Filled 2023-08-29 (×19): qty 1

## 2023-08-29 MED ORDER — VANCOMYCIN HCL 1250 MG/250ML IV SOLN
1250.0000 mg | Freq: Two times a day (BID) | INTRAVENOUS | Status: DC
  Administered 2023-08-29: 1250 mg via INTRAVENOUS
  Filled 2023-08-29 (×2): qty 250

## 2023-08-29 MED ORDER — SODIUM CHLORIDE 0.9 % IV SOLN: INTRAVENOUS | Status: DC

## 2023-08-29 MED ORDER — CHLORHEXIDINE GLUCONATE CLOTH 2 % EX PADS
6.0000 | MEDICATED_PAD | Freq: Every day | CUTANEOUS | Status: DC
  Administered 2023-08-29 – 2023-09-01 (×4): 6 via TOPICAL

## 2023-08-29 MED ORDER — INSULIN GLARGINE-YFGN 100 UNIT/ML ~~LOC~~ SOLN
15.0000 [IU] | Freq: Every morning | SUBCUTANEOUS | Status: DC
  Administered 2023-08-29 – 2023-09-01 (×4): 15 [IU] via SUBCUTANEOUS
  Filled 2023-08-29 (×4): qty 0.15

## 2023-08-29 NOTE — Consult Note (Signed)
 NAME: Evan Good  DOB: 24-Dec-1970  MRN: [id number]  Date/Time: 08/29/2023 6:22 PM  REQUESTING PROVIDER: [email or web address] Subjective:  REASON FOR CONSULT: staph epidermidis bacteremia ? Evan Good is a 53 y.o. with a history of DM, HTN, Gastric bypass, hidradenitis suppurativa, pyoderma gangrenosum anemia, prolonged hospitalization in 2021, 01/18/20-02/25/20 encephalopathy due to hypoglycemia and hyperammonemia ( 1300s) hypothermia, resulting in intubation questioning hypoxic brain injury  VS metabolic encephalopathy, followed by PEG/tracheostomy, decannulation of tracheostomy in Nov 2021, seizures, functional quadriplegia Presents to the ED from [facility] care on 2/14 from  with altered mental status and fever and vomiting and blood sugar of 489 Vitals in the ED  08/26/23 17:15  BP 126/72  Temp 102.1 F (38.9 C) !  Pulse Rate 126 !  Resp 21 !  SpO2 88 % (L)    Latest Reference Range & Units 08/26/23 17:37  WBC 4.0 - 10.5 K/uL 19.3 (H)  Hemoglobin 13.0 - 17.0 g/dL 91.4  HCT 78.2 - 95.6 % 45.6  Platelets 150 - 400 K/uL 249  Creatinine 0.61 - 1.24 mg/dL 2.13 (H)   Blood culture sent CT abdominal pelvis- considerable bladder wall thickening Blood on the penile meatus Started on Iv vanco and cefepime  Foley was placed in the ED Urine culture was sent the next day I am asked to see patient has blood culture positive for staph epidermidis As UC had no growth it was DC by the hospitalist    Past Medical History:  Diagnosis Date   COPD (chronic obstructive pulmonary disease) (HCC)    Diabetes mellitus without complication (HCC)    Seizures (HCC)   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  Past surgical history Tracheostomy PEG Gastric bypass  Social History   Socioeconomic History   Marital status: Married    Spouse name: Not on file   Number of children: Not on file   Years of education: Not on file   Highest education level: Not  on file  Occupational History   Not on file  Tobacco Use   Smoking status: Never   Smokeless tobacco: Never  Vaping Use   Vaping status: Never Used  Substance and Sexual Activity   Alcohol use: Not Currently   Drug use: Not Currently   Sexual activity: Not Currently  Other Topics Concern   Not on file  Social History Narrative   Not on file   Social Drivers of Health   Financial Resource Strain: Low Risk  (03/18/2020)   Received from [facility], [facility]   Overall Financial Resource Strain (CARDIA)    Difficulty of Paying Living Expenses: Not very hard  Food Insecurity: Patient Declined (08/27/2023)   Hunger Vital Sign    Worried About Running Out of Food in the Last Year: Patient declined    Ran Out of Food in the Last Year: Patient declined  Transportation Needs: Patient Declined (08/27/2023)   PRAPARE - Administrator, Civil Service (Medical): Patient declined    Lack of Transportation (Non-Medical): Patient declined  Physical Activity: Not on file  Stress: Not on file  Social Connections: Unknown (08/27/2023)   Social Connection and Isolation Panel [NHANES]    Frequency of Communication with Friends and Family: Patient declined    Frequency of Social Gatherings with Friends and Family: Patient declined    Attends Religious Services: Patient declined    Database administrator or Organizations: Not on file    Attends Banker  Meetings: Patient declined    Marital Status: Patient declined  Intimate Partner Violence: Patient Declined (08/27/2023)   Humiliation, Afraid, Rape, and Kick questionnaire    Fear of Current or Ex-Partner: Patient declined    Emotionally Abused: Patient declined    Physically Abused: Patient declined    Sexually Abused: Patient declined    History reviewed. No pertinent family history. No Known Allergies I? Current Facility-Administered Medications  Medication Dose Route Frequency Provider Last Rate Last Admin    0.9 %  sodium chloride infusion   Intravenous Continuous Kirstie Peri, MD 125 mL/hr at 08/29/23 1815 New Bag at 08/29/23 1815   acetaminophen (TYLENOL) tablet 650 mg  650 mg Oral Q6H PRN Andris Baumann, MD       Or   acetaminophen (TYLENOL) suppository 650 mg  650 mg Rectal Q6H PRN Andris Baumann, MD       calcium carbonate (TUMS - dosed in mg elemental calcium) chewable tablet 200 mg of elemental calcium  1 tablet Per Tube TID Kirstie Peri, MD   200 mg of elemental calcium at 08/29/23 1816   chlorhexidine (PERIDEX) 0.12 % solution 10 mL  10 mL Mouth/Throat BID Lindajo Royal V, MD   10 mL at 08/29/23 1045   Chlorhexidine Gluconate Cloth 2 % PADS 6 each  6 each Topical Daily Kirstie Peri, MD   6 each at 08/29/23 1046   enoxaparin (LOVENOX) injection 40 mg  40 mg Subcutaneous Q24H Lindajo Royal V, MD   40 mg at 08/29/23 1045   feeding supplement (GLUCERNA 1.5 CAL) liquid 1,000 mL  1,000 mL Per Tube Continuous Kirstie Peri, MD 55 mL/hr at 08/29/23 0424 Infusion Verify at 08/29/23 0424   free water 100 mL  100 mL Per Tube Q4H Kirstie Peri, MD   100 mL at 08/29/23 1600   insulin aspart (novoLOG) injection 0-9 Units  0-9 Units Subcutaneous Q4H Andris Baumann, MD   5 Units at 08/29/23 1816   insulin aspart (novoLOG) injection 4 Units  4 Units Subcutaneous Q4H Kirstie Peri, MD   4 Units at 08/29/23 1816   insulin glargine-yfgn (SEMGLEE) injection 15 Units  15 Units Subcutaneous q morning Kirstie Peri, MD   15 Units at 08/29/23 1447   insulin glargine-yfgn (SEMGLEE) injection 20 Units  20 Units Subcutaneous QHS Lindajo Royal V, MD   20 Units at 08/28/23 2349   levETIRAcetam (KEPPRA) tablet 1,000 mg  1,000 mg Per Tube BID Lindajo Royal V, MD   1,000 mg at 08/29/23 1045   multivitamin liquid 15 mL  15 mL Oral BID Mila Merry A, RPH   15 mL at 08/29/23 1046   nutrition supplement (JUVEN) (JUVEN) powder packet 1 packet  1 packet Per Tube BID BM Kirstie Peri, MD   1 packet at 08/29/23 1447    ondansetron (ZOFRAN) tablet 4 mg  4 mg Oral Q6H PRN Andris Baumann, MD       Or   ondansetron [facility]) injection 4 mg  4 mg Intravenous Q6H PRN Andris Baumann, MD       pantoprazole (PROTONIX) injection 40 mg  40 mg Intravenous Q24H Lindajo Royal V, MD   40 mg at 08/29/23 1045   selenium sulfide (SELSUN) 1 % shampoo   Topical Daily Kirstie Peri, MD       vancomycin (VANCOREADY) IVPB 1250 mg/250 mL  1,250 mg Intravenous Q12H Bari Mantis A, RPH 166.7 mL/hr at 08/29/23 1458 1,250 mg at 08/29/23 1458  Abtx:  Anti-infectives (From admission, onward)    Start     Dose/Rate Route Frequency Ordered Stop   08/29/23 2300  vancomycin (VANCOCIN) IVPB 1000 mg/200 mL premix  Status:  Discontinued        1,000 mg 200 mL/hr over 60 Minutes Intravenous Every 12 hours 08/28/23 1323 08/29/23 1112   08/29/23 1300  vancomycin (VANCOREADY) IVPB 1250 mg/250 mL        1,250 mg 166.7 mL/hr over 90 Minutes Intravenous Every 12 hours 08/29/23 1112     08/27/23 2000  ceFEPIme (MAXIPIME) 2 g in sodium chloride 0.9 % 100 mL IVPB  Status:  Discontinued        2 g 200 mL/hr over 30 Minutes Intravenous Every 24 hours 08/27/23 0125 08/27/23 0710   08/27/23 1230  vancomycin (VANCOREADY) IVPB 1500 mg/300 mL  Status:  Discontinued        1,500 mg 150 mL/hr over 120 Minutes Intravenous Every 24 hours 08/27/23 1217 08/28/23 1323   08/27/23 0800  ceFEPIme (MAXIPIME) 2 g in sodium chloride 0.9 % 100 mL IVPB  Status:  Discontinued        2 g 200 mL/hr over 30 Minutes Intravenous Every 8 hours 08/27/23 0710 08/27/23 1213   08/26/23 1830  ceFEPIme (MAXIPIME) 2 g in sodium chloride 0.9 % 100 mL IVPB        2 g 200 mL/hr over 30 Minutes Intravenous  Once 08/26/23 1824 08/26/23 2017   08/26/23 1830  vancomycin (VANCOCIN) IVPB 1000 mg/200 mL premix        1,000 mg 200 mL/hr over 60 Minutes Intravenous  Once 08/26/23 1824 08/26/23 2152   08/26/23 1830  metroNIDAZOLE (FLAGYL) IVPB 500 mg        500 mg 100 mL/hr over  60 Minutes Intravenous  Once 08/26/23 1824 08/26/23 2300       REVIEW OF SYSTEMS:  NA Objective:  VITALS:  BP 129/82 (BP Location: Right Arm)   Pulse (!) 113   Temp 98.8 F (37.1 C)   Resp 20   Ht 5\' 11"  (1.803 m)   Wt 72.6 kg   SpO2 100%   BMI 22.32 kg/m  LDA foley PHYSICAL EXAM:  General:Awake, just says yes, but does not respond to commands  Examination is limited because of his flexion contracture of upperextremities and inability to examine the patient Head: Normocephalic, without obvious abnormality, atraumatic. Eyes: Conjunctivae clear, anicteric sclerae. Pupils are equal ENT cannot examine Neck:  symmetrical, no adenopathy, thyroid: non tender no carotid bruit and no JVD. Back:did not examine Lungs: b/l air entry Heart: s1s2 Abdomen: Soft, peg in place B/l inguinal hidradenitis lesions, has silver dressing Unable to examine b/l axilla due to contractures Extremities: atraumatic, no cyanosis. No edema. No clubbing Skin: No rashes or lesions. Or bruising Lymph: Cervical, supraclavicular normal. Neurologic: cannot assess Pertinent Labs Lab Results CBC    Component Value Date/Time   WBC 7.5 08/28/2023 0955   RBC 4.54 08/28/2023 0955   HGB 12.4 (L) 08/28/2023 0955   HCT 41.7 08/28/2023 0955   PLT 156 08/28/2023 0955   MCV 91.9 08/28/2023 0955   MCH 27.3 08/28/2023 0955   MCHC 29.7 (L) 08/28/2023 0955   RDW 14.2 08/28/2023 0955   LYMPHSABS 3.7 08/26/2023 1737   MONOABS 1.9 (H) 08/26/2023 1737   EOSABS 0.1 08/26/2023 1737   BASOSABS 0.1 08/26/2023 1737       Latest Ref Rng & Units 08/29/2023    9:11 AM 08/28/2023  9:55 AM 08/27/2023    4:13 PM  CMP  Glucose 70 - 99 mg/dL 244  010  272   BUN 6 - 20 mg/dL 42  57  75   Creatinine 0.61 - 1.24 mg/dL 5.36  6.44  0.34   Sodium 135 - 145 mmol/L 152  153  155   Potassium 3.5 - 5.1 mmol/L 4.2  4.4  4.1   Chloride 98 - 111 mmol/L 120  118  117   CO2 22 - 32 mmol/L 25  26  27    Calcium 8.9 - 10.3 mg/dL 8.3   8.9  9.3   Total Protein 6.5 - 8.1 g/dL 6.6  6.4    Total Bilirubin 0.0 - 1.2 mg/dL 0.5  0.7    Alkaline Phos 38 - 126 U/L 114  94    AST 15 - 41 U/L 19  15    ALT 0 - 44 U/L 29  34        Microbiology: Recent Results (from the past 240 hours)  Culture, blood (Routine x 2)     Status: None (Preliminary result)   Collection Time: 08/26/23  5:37 PM   Specimen: BLOOD  Result Value Ref Range Status   Specimen Description   Final    BLOOD RIGHT ANTECUBITAL Performed at [facility], [address] [place], [place] [address]    Special Requests   Final    BOTTLES DRAWN AEROBIC AND ANAEROBIC Blood Culture results may not be optimal due to an inadequate volume of blood received in culture bottles Performed at [facility], [address], [place], [place] [address]    Culture  Setup Time   Final    GRAM POSITIVE COCCI IN BOTH AEROBIC AND ANAEROBIC BOTTLES CRITICAL RESULT CALLED TO, READ BACK BY AND VERIFIED WITH: MEGAN NOTCH ON 08/27/23 AT 0757 QSD Performed at [facility], [address] [place], [place] [address]    Culture   Final    CULTURE REINCUBATED FOR BETTER GROWTH Performed at [facility], [address] [place], [place] [address]    Report Status PENDING  Incomplete  Culture, blood (Routine x 2)     Status: Abnormal (Preliminary result)   Collection Time: 08/26/23  5:38 PM   Specimen: BLOOD  Result Value Ref Range Status   Specimen Description   Final    BLOOD BLOOD RIGHT HAND Performed at [facility], [address] [place], [place] [address]    Special Requests   Final    BOTTLES DRAWN AEROBIC AND ANAEROBIC Blood Culture results may not be optimal due to an inadequate volume of blood received in culture bottles Performed at [facility], [address] [place], [place] [address]    Culture  Setup Time   Final    GRAM POSITIVE COCCI IN BOTH AEROBIC AND ANAEROBIC BOTTLES MEGAN NOTCH ON 08/27/23 AT 6010  QSD Performed at [facility], [address], [place], [place] [address]    Culture (A)  Final    STAPHYLOCOCCUS SIMULANS CULTURE REINCUBATED FOR BETTER GROWTH AEROCOCCUS VIRIDANS Standardized susceptibility testing for this organism is not available. STAPHYLOCOCCUS COHNII    Report Status PENDING  Incomplete  Resp panel by RT-PCR (RSV, Flu A&B, Covid) Anterior Nasal Swab     Status: None   Collection Time: 08/26/23  5:38 PM   Specimen: Anterior Nasal Swab  Result Value Ref Range Status   SARS Coronavirus 2 by RT PCR NEGATIVE  NEGATIVE Final    Comment: (NOTE) SARS-CoV-2 target nucleic acids are NOT DETECTED.  The SARS-CoV-2 RNA is generally detectable in upper respiratory specimens during the acute phase of infection. The lowest concentration of SARS-CoV-2 viral copies this assay can detect is 138 copies/mL. A negative result does not preclude SARS-Cov-2 infection and should not be used as the sole basis for treatment or other patient management decisions. A negative result may occur with  improper specimen collection/handling, submission of specimen other than nasopharyngeal swab, presence of viral mutation(s) within the areas targeted by this assay, and inadequate number of viral copies(<138 copies/mL). A negative result must be combined with clinical observations, patient history, and epidemiological information. The expected result is Negative.  Fact Sheet for Patients:  BloggerCourse.com  Fact Sheet for Healthcare Providers:  SeriousBroker.it  This test is no t yet approved or cleared by the [place] FDA and  has been authorized for detection and/or diagnosis of SARS-CoV-2 by FDA under an Emergency Use Authorization (EUA). This EUA will remain  in effect (meaning this test can be used) for the duration of the COVID-19 declaration under Section 564(b)(1) of the Act, 21 U.S.C.section 360bbb-3(b)(1), unless the  authorization is terminated  or revoked sooner.       Influenza A by PCR NEGATIVE NEGATIVE Final   Influenza B by PCR NEGATIVE NEGATIVE Final    Comment: (NOTE) The Xpert Xpress SARS-CoV-2/FLU/RSV plus assay is intended as an aid in the diagnosis of influenza from Nasopharyngeal swab specimens and should not be used as a sole basis for treatment. Nasal washings and aspirates are unacceptable for Xpert Xpress SARS-CoV-2/FLU/RSV testing.  Fact Sheet for Patients: BloggerCourse.com  Fact Sheet for Healthcare Providers: SeriousBroker.it  This test is not yet approved or cleared by the [place] FDA and has been authorized for detection and/or diagnosis of SARS-CoV-2 by FDA under an Emergency Use Authorization (EUA). This EUA will remain in effect (meaning this test can be used) for the duration of the COVID-19 declaration under Section 564(b)(1) of the Act, 21 U.S.C. section 360bbb-3(b)(1), unless the authorization is terminated or revoked.     Resp Syncytial Virus by PCR NEGATIVE NEGATIVE Final    Comment: (NOTE) Fact Sheet for Patients: BloggerCourse.com  Fact Sheet for Healthcare Providers: SeriousBroker.it  This test is not yet approved or cleared by the [place] FDA and has been authorized for detection and/or diagnosis of SARS-CoV-2 by FDA under an Emergency Use Authorization (EUA). This EUA will remain in effect (meaning this test can be used) for the duration of the COVID-19 declaration under Section 564(b)(1) of the Act, 21 U.S.C. section 360bbb-3(b)(1), unless the authorization is terminated or revoked.  Performed at [facility], [address] [place], [place] [address]   Blood Culture ID Panel (Reflexed)     Status: Abnormal   Collection Time: 08/26/23  5:38 PM  Result Value Ref Range Status   Enterococcus faecalis NOT DETECTED NOT DETECTED  Final   Enterococcus Faecium NOT DETECTED NOT DETECTED Final   Listeria monocytogenes NOT DETECTED NOT DETECTED Final   Staphylococcus species DETECTED (A) NOT DETECTED Final    Comment: CRITICAL RESULT CALLED TO, READ BACK BY AND VERIFIED WITH: MEGAN NOTCH ON 08/27/23 AT 0757 QSD    Staphylococcus aureus (BCID) NOT DETECTED NOT DETECTED Final   Staphylococcus epidermidis DETECTED (A) NOT DETECTED Final    Comment: Methicillin (oxacillin) resistant coagulase negative staphylococcus. Possible blood culture contaminant (unless isolated from more than one blood culture draw or  clinical case suggests pathogenicity). No antibiotic treatment is indicated for blood  culture contaminants. CRITICAL RESULT CALLED TO, READ BACK BY AND VERIFIED WITH: MEGAN NOTCH ON 08/27/23 AT 0757 QSD    Staphylococcus lugdunensis NOT DETECTED NOT DETECTED Final   Streptococcus species NOT DETECTED NOT DETECTED Final   Streptococcus agalactiae NOT DETECTED NOT DETECTED Final   Streptococcus pneumoniae NOT DETECTED NOT DETECTED Final   Streptococcus pyogenes NOT DETECTED NOT DETECTED Final   A.calcoaceticus-baumannii NOT DETECTED NOT DETECTED Final   Bacteroides fragilis NOT DETECTED NOT DETECTED Final   Enterobacterales NOT DETECTED NOT DETECTED Final   Enterobacter cloacae complex NOT DETECTED NOT DETECTED Final   Escherichia coli NOT DETECTED NOT DETECTED Final   Klebsiella aerogenes NOT DETECTED NOT DETECTED Final   Klebsiella oxytoca NOT DETECTED NOT DETECTED Final   Klebsiella pneumoniae NOT DETECTED NOT DETECTED Final   Proteus species NOT DETECTED NOT DETECTED Final   Salmonella species NOT DETECTED NOT DETECTED Final   Serratia marcescens NOT DETECTED NOT DETECTED Final   Haemophilus influenzae NOT DETECTED NOT DETECTED Final   Neisseria meningitidis NOT DETECTED NOT DETECTED Final   Pseudomonas aeruginosa NOT DETECTED NOT DETECTED Final   Stenotrophomonas maltophilia NOT DETECTED NOT DETECTED Final    Candida albicans NOT DETECTED NOT DETECTED Final   Candida auris NOT DETECTED NOT DETECTED Final   Candida glabrata NOT DETECTED NOT DETECTED Final   Candida krusei NOT DETECTED NOT DETECTED Final   Candida parapsilosis NOT DETECTED NOT DETECTED Final   Candida tropicalis NOT DETECTED NOT DETECTED Final   Cryptococcus neoformans/gattii NOT DETECTED NOT DETECTED Final   Methicillin resistance mecA/C DETECTED (A) NOT DETECTED Final    Comment: CRITICAL RESULT CALLED TO, READ BACK BY AND VERIFIED WITH: MEGAN NOTCH ON 08/27/23 AT 0757 QSD Performed at [facility], [address] [place], [place] [address]   Urine Culture     Status: None   Collection Time: 08/27/23  3:20 AM   Specimen: Urine, Random  Result Value Ref Range Status   Specimen Description   Final    URINE, RANDOM Performed at [facility], [address] [place], [place] [address]    Special Requests   Final    NONE Reflexed from [id number] Performed at [facility], [address] [place], [place] [address]    Culture   Final    NO GROWTH Performed at [facility], [address] [place], [place] [address]    Report Status 08/28/2023 FINAL  Final    IMAGING RESULTS:  I have personally reviewed the films thickening of bladder wall   Impression/Recommendation Pt presenting from [facility] with fever, altered mental status  CT abdomen pelvis, showed thickened bladder wall and blood in the meatus  Sepsis- secondary to UTI- had blood in the meatus and bladder wall thickening- need to find out from [facility] whether a foley was attempted and whether he was on any antibiotics Though urine culture was neg ( done after antibiotic was started - so can be false neg) I still think his presentation is due to urosepsis and would recommend continuing antibiotics for that- he was on cefepime which was dc- can start ceftriaxone Pt has foley -  Bladder scan said only 18 ml- was placed in ED No documentation  of how much drained after foley was placed  Staph epidermidis in blood culture is skin contamination especially with 2 other organisms ( staph conii and aerococcus viridans in the blood culture) This is  not the cause of his clinical presentation Will not treat   Diabetes mellitus- with severe hyperglycemia on presentation AKI- has resolved  Hidradenitis suppurativa chronic  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

## 2023-08-29 NOTE — Plan of Care (Signed)
  Problem: Education: Goal: Ability to describe self-care measures that may prevent or decrease complications (Diabetes Survival Skills Education) will improve Outcome: Not Progressing Goal: Individualized Educational Video(s) Outcome: Not Progressing   Problem: Coping: Goal: Ability to adjust to condition or change in health will improve Outcome: Not Progressing   Problem: Fluid Volume: Goal: Ability to maintain a balanced intake and output will improve Outcome: Not Progressing   Problem: Health Behavior/Discharge Planning: Goal: Ability to identify and utilize available resources and services will improve Outcome: Not Progressing Goal: Ability to manage health-related needs will improve Outcome: Not Progressing   Problem: Metabolic: Goal: Ability to maintain appropriate glucose levels will improve Outcome: Not Progressing   Problem: Nutritional: Goal: Maintenance of adequate nutrition will improve Outcome: Not Progressing Goal: Progress toward achieving an optimal weight will improve Outcome: Not Progressing   Problem: Skin Integrity: Goal: Risk for impaired skin integrity will decrease Outcome: Not Progressing   Problem: Tissue Perfusion: Goal: Adequacy of tissue perfusion will improve Outcome: Not Progressing   Problem: Fluid Volume: Goal: Hemodynamic stability will improve Outcome: Not Progressing   Problem: Clinical Measurements: Goal: Diagnostic test results will improve Outcome: Not Progressing Goal: Signs and symptoms of infection will decrease Outcome: Not Progressing   Problem: Respiratory: Goal: Ability to maintain adequate ventilation will improve Outcome: Not Progressing   Problem: Education: Goal: Knowledge of General Education information will improve Description: Including pain rating scale, medication(s)/side effects and non-pharmacologic comfort measures Outcome: Not Progressing   Problem: Health Behavior/Discharge Planning: Goal: Ability  to manage health-related needs will improve Outcome: Not Progressing   Problem: Clinical Measurements: Goal: Ability to maintain clinical measurements within normal limits will improve Outcome: Not Progressing Goal: Will remain free from infection Outcome: Not Progressing Goal: Diagnostic test results will improve Outcome: Not Progressing Goal: Respiratory complications will improve Outcome: Not Progressing Goal: Cardiovascular complication will be avoided Outcome: Not Progressing   Problem: Activity: Goal: Risk for activity intolerance will decrease Outcome: Not Progressing   Problem: Nutrition: Goal: Adequate nutrition will be maintained Outcome: Not Progressing   Problem: Coping: Goal: Level of anxiety will decrease Outcome: Not Progressing   Problem: Elimination: Goal: Will not experience complications related to bowel motility Outcome: Not Progressing Goal: Will not experience complications related to urinary retention Outcome: Not Progressing   Problem: Pain Managment: Goal: General experience of comfort will improve and/or be controlled Outcome: Not Progressing   Problem: Safety: Goal: Ability to remain free from injury will improve Outcome: Not Progressing   Problem: Skin Integrity: Goal: Risk for impaired skin integrity will decrease Outcome: Not Progressing

## 2023-08-29 NOTE — Progress Notes (Addendum)
 Nutrition Follow-up  DOCUMENTATION CODES:   Non-severe (moderate) malnutrition in context of chronic illness  INTERVENTION:   -TF via j-tube:    Glucerna 1.5 @ 55 ml/hr    100 ml free water flush every 4 hours   Tube feeding regimen provides 1980 kcal (100% of needs), 109 grams of protein, and 1002 ml of H2O. Total free water: 1602 ml daily   -Continue MVI with minerals BID via tube -Continue 500 mg calcium carbonate TID via tube -1 packet Juven BID per tube, each packet provides 95 calories, 2.5 grams of protein (collagen), and 9.8 grams of carbohydrate (3 grams sugar); also contains 7 grams of L-arginine and L-glutamine, 300 mg vitamin C, 15 mg vitamin E, 1.2 mcg vitamin B-12, 9.5 mg zinc, 200 mg calcium, and 1.5 g  Calcium Beta-hydroxy-Beta-methylbutyrate to support wound healing   NUTRITION DIAGNOSIS:   Moderate Malnutrition related to chronic illness (CHF, anoxic brain injury) as evidenced by mild fat depletion, moderate fat depletion, mild muscle depletion, moderate muscle depletion.  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   Reviewed I/O's: +1.7 L x 24 hours and -579 ml since admission  UOP: 1.5 L x 24 hours  Pt is a resident of [facility].   Per [facility] notes today, noted stage 2 pressure injury to rt plantar surface.   Pt lying in bed at time of visit. He did not respond to voice or  touch. No family present to provide additional history.   Pt is NPO secondary to anoxic brain injury thought to be secondary to nutritional deficiencies from roux en y gastric bypass surgery. Pt is j-tube dependent and receive sole source nutrition via j-tube.   Pt tolerating TF at goal rate: Glucerna 1.5 @ 55 ml/hr.   Medications reviewed and include calcium carbonate, lovenox, keppra, and protonix.   Labs reviewed: Na: 153, CBGS: 266-339 (inpatient orders for glycemic control are 0-9 units insulin aspart every 4 hours, 4 units insulin aspart every 4 hours, 15 units insulin glargine-yfgn daily, and 20 units insulin glargine-yfgn daily). Noted improvements in CBGS and insulin recommendations from this AM have been implemented.   Nutrition-Focused Physical Exam Flowsheet Row Most Recent Value  Orbital Region Mild depletion  Upper Arm Region Moderate depletion  Thoracic and Lumbar Region Mild depletion  Buccal Region No depletion  Temple Region Moderate depletion  Clavicle Bone Region Mild depletion  Clavicle and Acromion Bone Region Mild depletion  Scapular Bone Region Mild depletion  Dorsal Hand Moderate depletion  Patellar Region Mild depletion  Anterior Thigh Region Mild depletion  Posterior Calf Region Mild depletion  Edema (RD Assessment) Moderate  Hair Reviewed  Eyes Reviewed  Mouth Reviewed  Skin Reviewed  Nails Reviewed        Diet Order:   Diet Order     None       EDUCATION NEEDS:   No education needs have been identified at this time  Skin:  Skin Assessment: Skin Integrity  Issues: Skin Integrity Issues:: Stage II Stage II: rt plantar surface Other: 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  Estimated Nutritional Needs:    Kcal:  1850-2050  Protein:  90-105 grams  Fluid:  > 1.8 L    Levada Schilling, RD, LDN, CDCES Registered Dietitian III Certified Diabetes Care and Education Specialist If unable to reach this RD, please use "RD Inpatient" group chat on secure chat between hours of 8am-4 pm daily

## 2023-08-29 NOTE — TOC Initial Note (Signed)
 Transition of Care [facility]) - Initial/Assessment Note    Patient Details  Name: Evan Good MRN: [id number] Date of Birth: [birth year]  Transition of Care [facility]) CM/SW Contact:    Allena Katz, LCSW Phone Number: 08/29/2023, 2:07 PM  Clinical Narrative:  Pt admitted from [facility] health care LTC with sepsis. TOC following for care plan updates.   Expected Discharge Plan: Skilled Nursing Facility Barriers to Discharge: Continued Medical Work up   Patient Goals and CMS Choice   CMS Medicare.gov Compare Post Acute Care list provided to:: Patient        Expected Discharge Plan and Services     Post Acute Care Choice: Skilled Nursing Facility Living arrangements for the past 2 months: Skilled Nursing Facility                                      Prior Living Arrangements/Services Living arrangements for the past 2 months: Skilled Nursing Facility Lives with:: Facility Resident   Do you feel safe going back to the place where you live?: Yes               Activities of Daily Living   ADL Screening (condition at time of admission) Independently performs ADLs?: No Does the patient have a NEW difficulty with bathing/dressing/toileting/self-feeding that is expected to last >3 days?: No Does the patient have a NEW difficulty with getting in/out of bed, walking, or climbing stairs that is expected to last >3 days?: No Does the patient have a NEW difficulty with communication that is expected to last >3 days?: No Is the patient deaf or have difficulty hearing?: No Does the patient have difficulty seeing, even when wearing glasses/contacts?: No Does the patient have difficulty concentrating, remembering, or making decisions?: Yes  Permission Sought/Granted                  Emotional Assessment       Orientation: : Oriented to Self Alcohol / Substance Use: Never Used    Admission diagnosis:  Sepsis secondary to UTI (HCC) [A41.9,  N39.0] Fever, unspecified fever cause [R50.9] Altered mental status, unspecified altered mental status type [R41.82] Patient Active Problem List   Diagnosis Date Noted   AKI (acute kidney injury) (HCC) 08/27/2023   Acute metabolic encephalopathy 08/27/2023   Uncontrolled type 2 diabetes mellitus with hyperglycemia, with long-term current use of insulin (HCC) 08/27/2023   Functional quadriplegia (HCC) 08/27/2023   Seizure disorder (HCC) 08/27/2023   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   Hematuria 08/27/2023   Electrolyte abnormality 08/27/2023   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   Cellulitis 12/16/2020   Cutaneous abscesses of both axillae 12/16/2020   History of anoxic brain injury 12/16/2020   Malnutrition of moderate degree 10/28/2020   Severe sepsis (HCC) 10/27/2020   Sepsis associated hypotension (HCC) 10/26/2020   Leukocytosis 10/26/2020   Cystitis 10/26/2020   Total self-care deficit 10/26/2020   Feeding tube blocked 10/26/2020   Pressure injury of skin 10/26/2020   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) 10/26/2020   PCP:  Patrecia Pour, MD Pharmacy:  No Pharmacies Listed    Social Drivers of Health (SDOH) Social History: SDOH Screenings   Food Insecurity: Patient Declined (08/27/2023)  Housing: Patient Declined (08/27/2023)  Transportation Needs: Patient Declined (08/27/2023)  Utilities: Patient Declined (08/27/2023)  Financial Resource Strain: Low Risk  (03/18/2020)   Received from [facility], [facility]  Social  Connections: Unknown (08/27/2023)  Tobacco Use: Low Risk  (08/26/2023)   SDOH Interventions:     Readmission Risk Interventions     No data to display

## 2023-08-29 NOTE — Inpatient Diabetes Management (Signed)
 Inpatient Diabetes Program Recommendations  AACE/ADA: New Consensus Statement on Inpatient Glycemic Control  Target Ranges:  Prepandial:   less than 140 mg/dL      Peak postprandial:   less than 180 mg/dL (1-2 hours)      Critically ill patients:  140 - 180 mg/dL    Latest Reference Range & Units 08/29/23 00:12 08/29/23 01:58 08/29/23 04:13 08/29/23 06:44 08/29/23 07:36  Glucose-Capillary 70 - 99 mg/dL 161 (H) 096 (H) 045 (H) 303 (H) 281 (H)    Latest Reference Range & Units 08/28/23 02:56 08/28/23 06:27 08/28/23 08:21 08/28/23 12:04 08/28/23 16:23 08/28/23 20:24  Glucose-Capillary 70 - 99 mg/dL 409 (H) 811 (H) 914 (H) 288 (H) 295 (H) 266 (H)   Review of Glycemic Control  Diabetes history: DM2 Outpatient Diabetes medications: Lantus 14 units BID, Fiasp 5 units TID with meals, Metformin 500 mg BID Current orders for Inpatient glycemic control: Semglee 20 units at bedtime, Novolog 0-9 units Q4H; Glucerna @ 55 ml/hr  Inpatient Diabetes Program Recommendations:    Insulin: CBGs have ranged from 259-339 mg/dl over the past 8 hours.  Please consider ordering Semglee 15 units QAM (to start now) and ordering Novolog 4 units Q4H for tube feeding coverage. If tube feeding is stopped or held then Novolog tube feeding coverage should also be stopped or held.  Thanks, Orlando Penner, RN, MSN, CDCES Diabetes Coordinator Inpatient Diabetes Program [phone] (Team Pager from 8am to 5pm)

## 2023-08-29 NOTE — Progress Notes (Signed)
 Pharmacy Antibiotic Note  Evan Good is a 53 y.o. adult admitted on 08/26/2023 with fever and emesis. Bcx 4 of 4 positive for GPC. BCID identified Staph epi, MecA+. Patient has PMH of anoxic brain injury, HFrEF, Seizures, and DM.   Pharmacy has been consulted for vancomycin dosing. Patient received vancomycin 1g IV 2/14 @ 2023.  -Bcx 4 of 4 bottles GPC  Plan: Scr 1.06 >> 0.85. Will adjust vancomycin dose to 1250 mg q12H. Predicted AUC of 528. Goal AUC of 400-600.   Cmin 13.8   Vd 0.72, Scr 0.85 TBW. Plan to obtain vancomycin levels after 4th or 5th dose.   Pharmacy will continue to monitor renal function and adjust dose as needed.   Height: 5\' 11"  (180.3 cm) Weight: 72.6 kg (160 lb) IBW/kg (Calculated) : 75.3  Temp (24hrs), Avg:98.8 F (37.1 C), Min:98.1 F (36.7 C), Max:99.5 F (37.5 C)  Recent Labs  Lab 08/26/23 1737 08/26/23 2048 08/27/23 0239 08/27/23 1613 08/28/23 0955 08/29/23 0911  WBC 19.3*  --   --   --  7.5  --   CREATININE 1.97*  --  1.44* 1.11 1.06 0.85  LATICACIDVEN 4.5* 3.7*  --   --  2.1*  --     Estimated Creatinine Clearance (by C-G formula based on SCr of 0.85 mg/dL) Male: 29.5 mL/min Male: 104.4 mL/min    No Known Allergies  Antimicrobials this admission: 2/14 cefepime >> 2/15 2/14 Vancomycin >>   Dose adjustments this admission: N/A  Microbiology results: 2/14 BCx: 4 of 4 GPC, BCID: Staph epi, MecA (+) 2/15 UCx: NG  Thank you for allowing pharmacy to be a part of this patient's care.  Angelique Blonder, PharmD Clinical Pharmacist 08/29/2023 11:09 AM

## 2023-08-29 NOTE — Progress Notes (Signed)
 Progress Note   Patient: Evan Good WGN:[id number] DOB: [birth year] DOA: 08/26/2023     2 DOS: the patient was seen and examined on 08/29/2023   Brief hospital course:  53 y.o. 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  ED course and data review: Tmax 102.1 and tachycardic to 126, tachypneic to 21 with O2 sat 88% on room air improving to low 90s on 2 L Labs notable for WBC 19,000 with lactic acid 3.7 Respiratory viral panel negative Glucose 658 with normal anion gap, potassium 5.5, creatinine 1.97 most recent baseline 0.71 from a couple years prior CT abdomen and pelvis showing considerable bladder wall thickening related to decompression likely, underlying bladder lesion cannot be totally excluded Chest x-ray no active disease Patient treated with sepsis fluids, given insulin 10 units IV and started on Maxipime and vancomycin Patient was noted to have blood at the meatus on physical exam   2/16  : Urine cultures were negative, empiric antibiotics for UTI was stopped.  Patient however had bacteremia positive for staph epi was started on vancomycin.  2/17 : Had improvement in symptoms more perky.  Interaction with staff with subjective more responsive to certain people.  Continues to stay on vancomycin for bacteremia.  ID following.  Wound care consulted for HS under arms.   Assessment and Plan: Severe sepsis  / Gross hematuria -improving/resolving  Etiology uncertain but suspecting GU given bladder wall thickening and gross blood seen at meatus-radiology states underlying bladder lesion cannot be totally excluded  -patient urine stayed clear after the first day.  Some turbidity  was seen after 48 hours on 2/17. Sepsis criteria include fever, tachycardia and tachypnea with hypoxia, leukocytosis and lactic acidosis, received sepsis protocol was ordered on presentation Received cefepime for 1 day which was discontinued later on as UTI was ruled out.   -Patient however was bacteremia started on vancomycin.(Likely source wounds) Staph epi positive in multiple culture bottles. -ID consulted for bacteremia  AKI (acute kidney injury) -improving with creatinine of 0.85 with BUN 42.  AKI secondary to dehydration in the setting of sepsis. -Continue with IV fluids at 125 cc/h which would address hypernatremia of 152 and elevated BUN. -Continue to trend sodium with serial BMPs.  Acute metabolic encephalopathy -improving(likely now at his baseline) Neurologic checks with fall and aspiration precautions.  Uncontrolled type 2 diabetes mellitus with hyperglycemia, with long-term current use of insulin  Basal insulin with sliding scale coverage Received IV insulin with a fluid bolus in the ED with improvement in blood sugar from 658 to the 400s  Electrolyte abnormality Hypernatremia and hyperkalemia Monitor for improvement with hydration and correct accordingly -Continue with free water 100 cc flushes every 4 hourly. -On normal saline at 125 cc/h  Gastrostomy status  Gastrostomy care  Seizure disorder  Continue Keppra via G-tube  History of anoxic brain injury Functional quadriplegia Supportive care     Subjective: Patient seen and examined this morning.  Weaned off of oxygen.  Saturating normal on room air.  ID consulted for ongoing bacteremia secondary to staph epi.  Physical Exam: Vitals:   08/28/23 0743 08/28/23 2134 08/29/23 0016 08/29/23 0410  BP: 113/66 113/69 111/81 110/87  Pulse: (!) 116 Marland Kitchen)  113 (!) 110 (!) 109  Resp: 16 18 16 18   Temp: 98.2 F (36.8 C) 99.5 F (37.5 C) 98.1 F (36.7 C)  98.8 F (37.1 C)  TempSrc:      SpO2:  97% 98% 99%  Weight:      Height:       Physical Exam Constitutional:      General: He is not in acute distress.    Appearance: He is ill-appearing. He is not toxic-appearing.  HENT:     Head: Normocephalic and atraumatic.     Nose: No congestion.  Eyes:     Extraocular Movements: Extraocular movements intact.     Pupils: Pupils are equal, round, and reactive to light.  Cardiovascular:     Rate and Rhythm: Normal rate.  Pulmonary:     Effort: No respiratory distress.     Breath sounds: No stridor.  Abdominal:     Palpations: Abdomen is soft.  Genitourinary:    Comments: Foley in place.  Clear urine no blood Musculoskeletal:     Cervical back: Neck supple. No tenderness.     Comments: Left upper extremity in contracted state, left lower extremity no strength.  Patient has quadriplegia. Skin:    General: Skin is warm and dry.  Neurological:     Comments: Quadriplegic no new focal deficit    Data Reviewed:  Results are pending, will review when available.  Family Communication: None by bedside  Disposition: Status is: Inpatient Remains inpatient appropriate because: Bacteremia  Planned Discharge Destination: Skilled nursing facility    Time spent: 36 minutes  Author: Kirstie Peri, MD 08/29/2023 3:11 PM  For on call review [email or web address].

## 2023-08-29 NOTE — Consult Note (Addendum)
 WOC Nurse Consult Note: Reason for Consult: HS wounds; addressed in consult yesterday 2/16 LE evaluated today, scarring over the patient's LEs, heels are intact  Wound type: Stage 2 Pressure Injury; right plantar surface  Pressure Injury POA: Yes Measurement: 3cm x 3cm x 0cm  Wound bed: serous filled blister Drainage (amount, consistency, odor) none Periwound: intact, scarring over the UE/LE Dressing procedure/placement/frequency: Continue foam dressings to the bilateral heels for protection in high risk patient Single layer of xeroform to the right plantar foot wound, top with foam.  Offload heels at all times with prevalon boots.  Turn and reposition per hospital policy  Air mattress for high risk patient.   Re consult if needed, will not follow at this time. Thanks  Jeren Dufrane [employer], RN,CWOCN, CNS, CWON-AP [phone])

## 2023-08-30 DIAGNOSIS — E870 Hyperosmolality and hypernatremia: Secondary | ICD-10-CM | POA: Diagnosis not present

## 2023-08-30 DIAGNOSIS — Z794 Long term (current) use of insulin: Secondary | ICD-10-CM

## 2023-08-30 DIAGNOSIS — E1165 Type 2 diabetes mellitus with hyperglycemia: Secondary | ICD-10-CM

## 2023-08-30 DIAGNOSIS — A419 Sepsis, unspecified organism: Secondary | ICD-10-CM | POA: Diagnosis not present

## 2023-08-30 DIAGNOSIS — R6520 Severe sepsis without septic shock: Secondary | ICD-10-CM | POA: Diagnosis not present

## 2023-08-30 LAB — GLUCOSE, CAPILLARY
Glucose-Capillary: 106 mg/dL — ABNORMAL HIGH (ref 70–99)
Glucose-Capillary: 124 mg/dL — ABNORMAL HIGH (ref 70–99)
Glucose-Capillary: 138 mg/dL — ABNORMAL HIGH (ref 70–99)
Glucose-Capillary: 138 mg/dL — ABNORMAL HIGH (ref 70–99)
Glucose-Capillary: 151 mg/dL — ABNORMAL HIGH (ref 70–99)
Glucose-Capillary: 179 mg/dL — ABNORMAL HIGH (ref 70–99)
Glucose-Capillary: 199 mg/dL — ABNORMAL HIGH (ref 70–99)

## 2023-08-30 LAB — CBC
HCT: 32.6 % — ABNORMAL LOW (ref 39.0–52.0)
Hemoglobin: 9.7 g/dL — ABNORMAL LOW (ref 13.0–17.0)
MCH: 27.6 pg (ref 26.0–34.0)
MCHC: 29.8 g/dL — ABNORMAL LOW (ref 30.0–36.0)
MCV: 92.6 fL (ref 80.0–100.0)
Platelets: 151 10*3/uL (ref 150–400)
RBC: 3.52 MIL/uL — ABNORMAL LOW (ref 4.22–5.81)
RDW: 14.2 % (ref 11.5–15.5)
WBC: 9.4 10*3/uL (ref 4.0–10.5)
nRBC: 0.2 % (ref 0.0–0.2)

## 2023-08-30 LAB — CREATININE, SERUM
Creatinine, Ser: 0.74 mg/dL (ref 0.61–1.24)
GFR, Estimated: >60 mL/min (ref >60)

## 2023-08-30 MED ORDER — FREE WATER
200.0000 mL | Status: DC
  Administered 2023-08-30 – 2023-09-01 (×15): 200 mL

## 2023-08-30 MED ORDER — INSULIN GLARGINE-YFGN 100 UNIT/ML ~~LOC~~ SOLN
15.0000 [IU] | Freq: Every day | SUBCUTANEOUS | Status: DC
  Administered 2023-08-31 (×2): 15 [IU] via SUBCUTANEOUS
  Filled 2023-08-30 (×3): qty 0.15

## 2023-08-30 NOTE — Hospital Course (Signed)
 53 y.o. 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   ED course and data review: Tmax 102.1 and tachycardic to 126, tachypneic to 21 with O2 sat 88% on room air improving to low 90s on 2 L Labs notable for WBC 19,000 with lactic acid 3.7 Respiratory viral panel negative Glucose 658 with normal anion gap, potassium 5.5, creatinine 1.97 most recent baseline 0.71 from a couple years prior CT abdomen and pelvis showing considerable bladder wall thickening related to decompression likely, underlying bladder lesion cannot be totally excluded Chest x-ray no active disease Patient treated with sepsis fluids, given insulin 10 units IV and started on Maxipime and vancomycin Patient was noted to have blood at the meatus on physical exam   2/16  : Urine cultures were negative, empiric antibiotics for UTI was stopped.  Patient however had bacteremia positive for staph epi was started on vancomycin.   2/17 : Patient improving, ID consult deemed positive blood culture as contaminants.  Sepsis caused by urinary tract infection.  Rocephin restarted.  2/20.  Patient has been giving more free water, receiving metolazone for hypernatremia.  Sodium level gradually improving.  Continue increased free water at discharge.  Medically stable.

## 2023-08-30 NOTE — Care Management Important Message (Signed)
 Important Message  Patient Details  Name: Evan Good MRN: [id number] Date of Birth: 1971-07-08   Important Message Given:  Yes - Medicare IM     Samariya Rockhold W, CMA 08/30/2023, 10:28 AM

## 2023-08-30 NOTE — Progress Notes (Signed)
 PT Cancellation Note  Patient Details Name: Evan Good MRN: [id number] DOB: Jul 26, 1970   Cancelled Treatment:    Reason Eval/Treat Not Completed: PT screened, no needs identified, will sign off.  Spoke to patient's nurse at [facility].  Per nursing the patient is totally dependent at baseline and unable to provide any physical assist with functional tasks.  Pt requires a hoyer lift for transfers and total care for ADLs.  No skilled PT needs at this time with PT orders to be completed.    Ovidio Hanger PT, DPT 08/30/23, 8:47 AM

## 2023-08-30 NOTE — Progress Notes (Signed)
 Progress Note   Patient: Evan Good ZOX:[id number] DOB: 02-06-1971 DOA: 08/26/2023     3 DOS: the patient was seen and examined on 08/30/2023   Brief hospital course: 53 y.o. 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   ED course and data review: Tmax 102.1 and tachycardic to 126, tachypneic to 21 with O2 sat 88% on room air improving to low 90s on 2 L Labs notable for WBC 19,000 with lactic acid 3.7 Respiratory viral panel negative Glucose 658 with normal anion gap, potassium 5.5, creatinine 1.97 most recent baseline 0.71 from a couple years prior CT abdomen and pelvis showing considerable bladder wall thickening related to decompression likely, underlying bladder lesion cannot be totally excluded Chest x-ray no active disease Patient treated with sepsis fluids, given insulin 10 units IV and started on Maxipime and vancomycin Patient was noted to have blood at the meatus on physical exam   2/16  : Urine cultures were negative, empiric antibiotics for UTI was stopped.  Patient however had bacteremia positive for staph epi was started on vancomycin.   2/17 : Patient improving, ID consult deemed positive blood culture as contaminants.  Sepsis caused by urinary tract infection.  Rocephin restarted.   Principal Problem:   Severe sepsis (HCC) Active Problems:   Hematuria   AKI (acute kidney injury) (HCC)   Acute metabolic encephalopathy   Uncontrolled type 2 diabetes mellitus with hyperglycemia, with long-term current use of insulin (HCC)   Seizure disorder (HCC)   Gastrostomy status (HCC)   Electrolyte  abnormality   History of anoxic brain injury   Functional quadriplegia (HCC)   Protein-calorie malnutrition, moderate (HCC)   Hypernatremia   Assessment and Plan: Severe sepsis   Acute cystitis with hematuria. Present with significant leukocytosis, tachycardia, severe lactic acidosis of 4.9. Etiology is from GU given bladder wall thickening and gross hematuria. So far patient urine culture has been negative, but antibiotics continued. Blood culture was believed to be from contaminants per ID.  Continue Rocephin for now.   AKI (acute kidney injury)  Hypernatremia. Hyperkalemia. Acute kidney injury is due to severe sepsis, renal function has improved after fluids.  However, patient continued to have significant hypernatremia, I will discontinue IV fluids with normal saline, increase free water from tube feeding.  Monitor sodium level.    Acute metabolic encephalopathy -improving(likely now at his baseline) Anoxic brain injury. Functional quadriplegia. Mental status improved, but nonverbal.  Uncontrolled type 2 diabetes mellitus with hyperglycemia, with long-term current use of insulin  Close increase better, continue long-acting insulin and sliding scale insulin.  Gastrostomy status  Gastrostomy care   Seizure disorder  Continue Keppra via G-tube        Subjective:  Patient is nonverbal, but more interactive.  Physical Exam: Vitals:   08/30/23 0016 08/30/23 0450 08/30/23 0857 08/30/23 1147  BP: 129/75 114/74 134/80 134/78  Pulse:  (!) 112  (!) 116  Resp: 19  18 18   Temp: 98.3 F (36.8 C) 99.4 F (37.4 C) 98.4 F (36.9 C) 99.7 F (37.6 C)  TempSrc:    Axillary  SpO2: 100% 100% 99% 100%  Weight:      Height:  General exam: Appears calm and comfortable  Respiratory system: Clear to auscultation. Respiratory effort normal. Cardiovascular system: S1 & S2 heard, RRR. No JVD, murmurs, rubs, gallops or clicks. No pedal edema. Gastrointestinal system: Abdomen  is nondistended, soft and nontender. No organomegaly or masses felt. Normal bowel sounds heard. Central nervous system: Alert and nonverbal. No focal neurological deficits. Extremities: Symmetric 5 x 5 power. Skin: No rashes, lesions or ulcers Psychiatry:  Mood & affect appropriate.    Data Reviewed:  Review lab results.  Family Communication: None  Disposition: Status is: Inpatient Remains inpatient appropriate because: Severity of disease, IV treatment.     Time spent: 50 minutes  Author: Marrion Coy, MD 08/30/2023 3:10 PM  For on call review [email or web address].

## 2023-08-30 NOTE — Progress Notes (Signed)
 [facility] Liaison Note  Evan Good is currently followed by palliative care through [facility].  Thank you, Norris Cross, RN Nurse Liaison [phone]

## 2023-08-31 DIAGNOSIS — R509 Fever, unspecified: Secondary | ICD-10-CM | POA: Diagnosis not present

## 2023-08-31 DIAGNOSIS — N390 Urinary tract infection, site not specified: Secondary | ICD-10-CM | POA: Diagnosis not present

## 2023-08-31 DIAGNOSIS — E870 Hyperosmolality and hypernatremia: Secondary | ICD-10-CM | POA: Diagnosis not present

## 2023-08-31 DIAGNOSIS — N179 Acute kidney failure, unspecified: Secondary | ICD-10-CM

## 2023-08-31 DIAGNOSIS — R319 Hematuria, unspecified: Secondary | ICD-10-CM | POA: Diagnosis not present

## 2023-08-31 DIAGNOSIS — D696 Thrombocytopenia, unspecified: Secondary | ICD-10-CM | POA: Insufficient documentation

## 2023-08-31 DIAGNOSIS — R6520 Severe sepsis without septic shock: Secondary | ICD-10-CM | POA: Diagnosis not present

## 2023-08-31 DIAGNOSIS — A419 Sepsis, unspecified organism: Secondary | ICD-10-CM | POA: Diagnosis not present

## 2023-08-31 LAB — CULTURE, BLOOD (ROUTINE X 2)

## 2023-08-31 LAB — GLUCOSE, CAPILLARY
Glucose-Capillary: 173 mg/dL — ABNORMAL HIGH (ref 70–99)
Glucose-Capillary: 210 mg/dL — ABNORMAL HIGH (ref 70–99)
Glucose-Capillary: 235 mg/dL — ABNORMAL HIGH (ref 70–99)
Glucose-Capillary: 219 mg/dL — ABNORMAL HIGH (ref 70–99)
Glucose-Capillary: 199 mg/dL — ABNORMAL HIGH (ref 70–99)
Glucose-Capillary: 164 mg/dL — ABNORMAL HIGH (ref 70–99)

## 2023-08-31 LAB — CBC
HCT: 32.9 % — ABNORMAL LOW (ref 39.0–52.0)
Hemoglobin: 9.8 g/dL — ABNORMAL LOW (ref 13.0–17.0)
MCH: 27.8 pg (ref 26.0–34.0)
MCHC: 29.8 g/dL — ABNORMAL LOW (ref 30.0–36.0)
MCV: 93.2 fL (ref 80.0–100.0)
Platelets: 138 10*3/uL — ABNORMAL LOW (ref 150–400)
RBC: 3.53 MIL/uL — ABNORMAL LOW (ref 4.22–5.81)
RDW: 14.5 % (ref 11.5–15.5)
WBC: 9 10*3/uL (ref 4.0–10.5)
nRBC: 0.3 % — ABNORMAL HIGH (ref 0.0–0.2)

## 2023-08-31 LAB — MAGNESIUM: Magnesium: 2.2 mg/dL (ref 1.7–2.4)

## 2023-08-31 LAB — BASIC METABOLIC PANEL
Anion gap: 8 (ref 5–15)
BUN: 29 mg/dL — ABNORMAL HIGH (ref 6–20)
CO2: 26 mmol/L (ref 22–32)
Calcium: 8.1 mg/dL — ABNORMAL LOW (ref 8.9–10.3)
Chloride: 117 mmol/L — ABNORMAL HIGH (ref 98–111)
Creatinine, Ser: 0.71 mg/dL (ref 0.61–1.24)
GFR, Estimated: >60 mL/min (ref >60)
Glucose, Bld: 241 mg/dL — ABNORMAL HIGH (ref 70–99)
Potassium: 4.2 mmol/L (ref 3.5–5.1)
Sodium: 151 mmol/L — ABNORMAL HIGH (ref 135–145)

## 2023-08-31 LAB — PHOSPHORUS: Phosphorus: 2.5 mg/dL (ref 2.5–4.6)

## 2023-08-31 MED ORDER — ADULT MULTIVITAMIN W/MINERALS CH
1.0000 | ORAL_TABLET | Freq: Two times a day (BID) | ORAL | Status: DC
  Administered 2023-08-31 – 2023-09-01 (×2): 1
  Filled 2023-08-31 (×2): qty 1

## 2023-08-31 MED ORDER — METOLAZONE 5 MG PO TABS
5.0000 mg | ORAL_TABLET | Freq: Once | ORAL | Status: AC
  Administered 2023-08-31: 5 mg via ORAL
  Filled 2023-08-31: qty 1

## 2023-08-31 NOTE — Inpatient Diabetes Management (Signed)
 Inpatient Diabetes Program Recommendations  AACE/ADA: New Consensus Statement on Inpatient Glycemic Control   Target Ranges:  Prepandial:   less than 140 mg/dL      Peak postprandial:   less than 180 mg/dL (1-2 hours)      Critically ill patients:  140 - 180 mg/dL    Latest Reference Range & Units 08/31/23 02:38 08/31/23 04:19 08/31/23 07:54  Glucose-Capillary 70 - 99 mg/dL 098 (H) 119 (H) 147 (H)    Latest Reference Range & Units 08/30/23 08:52 08/30/23 11:50 08/30/23 16:44 08/30/23 19:49 08/30/23 23:10  Glucose-Capillary 70 - 99 mg/dL 829 (H) 562 (H) 130 (H) 138 (H) 124 (H)   Review of Glycemic Control  Diabetes history: DM2 Outpatient Diabetes medications: Lantus 14 units BID, Fiasp 5 units TID with meals, Metformin 500 mg BID Current orders for Inpatient glycemic control: Semglee 15 units BID, Novolog 0-9 units Q4H, Novolog 4 units Q4H; Glucerna @ 55 ml/hr   Inpatient Diabetes Program Recommendations:    Insulin: Please consider increasing Semglee to 17 units BID.  Thanks, Orlando Penner, RN, MSN, CDCES Diabetes Coordinator Inpatient Diabetes Program [phone] (Team Pager from 8am to 5pm)

## 2023-08-31 NOTE — Progress Notes (Signed)
 Date of Admission:  08/26/2023    ID: Evan Good is a 53 y.o. adult with   Principal Problem:   Severe sepsis (HCC) Active Problems:   Protein-calorie malnutrition, moderate (HCC)   History of anoxic brain injury   AKI (acute kidney injury) (HCC)   Acute metabolic encephalopathy   Uncontrolled type 2 diabetes mellitus with hyperglycemia, with long-term current use of insulin (HCC)   Functional quadriplegia (HCC)   Seizure disorder (HCC)   Gastrostomy status (HCC)   Hematuria   Electrolyte abnormality   Hypernatremia   Evan Good is a 53 y.o. with a history of DM, HTN, Gastric bypass, hidradenitis suppurativa, pyoderma gangrenosum anemia, prolonged hospitalization in 2021, 01/18/20-02/25/20 encephalopathy due to hypoglycemia and hyperammonemia ( 1300s) hypothermia, resulting in intubation questioning hypoxic brain injury  VS metabolic encephalopathy, followed by PEG/tracheostomy, decannulation of tracheostomy in Nov 2021, seizures, functional quadriplegia Presents to the ED from [facility] care on 2/14 from  with altered mental status and fever and vomiting and blood sugar of 489 I spoke to [facility] They say he never had any urinary issues they recognized HE was on long term doxy for hidradenitis   Subjective: Pt smiles and says hello But does not say much  Medications:   calcium carbonate  1 tablet Per Tube TID   chlorhexidine  10 mL Mouth/Throat BID   Chlorhexidine Gluconate Cloth  6 each Topical Daily   enoxaparin (LOVENOX) injection  40 mg Subcutaneous Q24H   free water  200 mL Per Tube Q4H   insulin aspart  0-9 Units Subcutaneous Q4H   insulin aspart  4 Units Subcutaneous Q4H   insulin glargine-yfgn  15 Units Subcutaneous q morning   insulin glargine-yfgn  15 Units Subcutaneous QHS   levETIRAcetam  1,000 mg Per Tube BID   multivitamin with minerals  1 tablet Per Tube BID WC   nutrition supplement (JUVEN)  1 packet Per Tube BID  BM   pantoprazole (PROTONIX) IV  40 mg Intravenous Q24H   selenium sulfide   Topical Daily    Objective: Vital signs in last 24 hours: Patient Vitals for the past 24 hrs:  BP Temp Temp src Pulse Resp SpO2  08/31/23 0417 122/86 98.8 F (37.1 C) -- (!) 110 -- 100 %  08/30/23 2310 103/64 98.1 F (36.7 C) -- (!) 102 -- 99 %  08/30/23 1948 117/76 98.4 F (36.9 C) -- (!) 105 -- 99 %  08/30/23 1642 131/89 99.1 F (37.3 C) -- (!) 110 18 100 %  08/30/23 1147 134/78 99.7 F (37.6 C) Axillary (!) 116 18 100 %     LDA Foley   PHYSICAL EXAM:  General: awake, no distress, appears stated age.  lungs: b/l air entry. Heart: Regular rate and rhythm, no murmur, rub or gallop. Abdomen: Soft, peg Bowel sounds normal. No masses Extremities: atraumatic, no cyanosis. No edema. No clubbing Skin: No rashes or lesions. Or bruising Lymph: Cervical, supraclavicular normal. Neurologic: Grossly non-focal  Lab Results    Latest Ref Rng & Units 08/31/2023    5:44 AM 08/30/2023    5:15 AM 08/28/2023    9:55 AM  CBC  WBC 4.0 - 10.5 K/uL 9.0  9.4  7.5   Hemoglobin 13.0 - 17.0 g/dL 9.8  9.7  72.5   Hematocrit 39.0 - 52.0 % 32.9  32.6  41.7   Platelets 150 - 400 K/uL 138  151  156        Latest  Ref Rng & Units 08/31/2023    5:44 AM 08/30/2023    5:15 AM 08/29/2023    9:11 AM  CMP  Glucose 70 - 99 mg/dL 161   096   BUN 6 - 20 mg/dL 29   42   Creatinine 0.45 - 1.24 mg/dL 4.09  8.11  9.14   Sodium 135 - 145 mmol/L 151   152   Potassium 3.5 - 5.1 mmol/L 4.2   4.2   Chloride 98 - 111 mmol/L 117   120   CO2 22 - 32 mmol/L 26   25   Calcium 8.9 - 10.3 mg/dL 8.1   8.3   Total Protein 6.5 - 8.1 g/dL   6.6   Total Bilirubin 0.0 - 1.2 mg/dL   0.5   Alkaline Phos 38 - 126 U/L   114   AST 15 - 41 U/L   19   ALT 0 - 44 U/L   29       Microbiology:  Studies/Results: No results found.   Assessment/Plan: Pt presenting from [facility] with fever, altered mental status  CT abdomen pelvis, showed thickened  bladder wall and blood in the meatus was noted clinically by other staff   Sepsis- ? secondary to UTI- had blood in the meatus and bladder wall thickening- discussed with [facility], he never had any urinary issues they had identified, never had catheter, not anyurine culture was sent Though urine culture was neg ( done after antibiotic was started - so can be false neg) I still think his presentation is due to urosepsis and would recommend continuing antibiotics for that- he was on cefepime which was dc- can start ceftriaxone Pt has foley -  Bladder scan said only 18 ml- was placed in ED No documentation of how much drained after foley was placed Dr.Zhang will do voiding trial- may need bladder scan after removal of foley Management as per Dr.Zhang   Staph epidermidis in blood culture is skin contamination especially with 2 other organisms ( staph conii and aerococcus viridans in the blood culture) This is not the cause of his clinical presentation Will not treat     Diabetes mellitus- with severe hyperglycemia on presentation AKI- has resolved   Hidradenitis suppurativa chronic   Brain injury due to hypoxia due to hypoglycemia and hyper ammonemia in 2021 PEG Had tracheostomy   Functional quadriplegia  Discussed with Dr.Zhang ID will sign off- call if needed

## 2023-08-31 NOTE — Progress Notes (Signed)
 Nutrition Follow-up  DOCUMENTATION CODES:   Non-severe (moderate) malnutrition in context of chronic illness  INTERVENTION:   -Obtain new wt -TF via j-tube:    Glucerna 1.5 @ 55 ml/hr    100 ml free water flush every 4 hours   Tube feeding regimen provides 1980 kcal (100% of needs), 109 grams of protein, and 1002 ml of H2O. Total free water: 1602 ml daily   -Continue MVI with minerals BID via tube -Continue 500 mg calcium carbonate TID via tube -Continue 1 packet Juven BID per tube, each packet provides 95 calories, 2.5 grams of protein (collagen), and 9.8 grams of carbohydrate (3 grams sugar); also contains 7 grams of L-arginine and L-glutamine, 300 mg vitamin C, 15 mg vitamin E, 1.2 mcg vitamin B-12, 9.5 mg zinc, 200 mg calcium, and 1.5 g  Calcium Beta-hydroxy-Beta-methylbutyrate to support wound healing    NUTRITION DIAGNOSIS:   Moderate Malnutrition related to chronic illness (CHF, anoxic brain injury) as evidenced by mild fat depletion, moderate fat depletion, mild muscle depletion, moderate muscle depletion.  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  Reviewed I/O's: -690 ml x 24 hours and -2 L since admission  UOP: 1.7 L x 24 hours   Pt is a resident of [facility]. Plan to return once medically stable.    Per [facility] notes today, noted stage 2 pressure injury to rt plantar surface.     Pt lying in bed at time of visit. He did not respond to voice or touch. No family present to provide additional history.   Pt is NPO secondary to anoxic brain injury thought to be secondary to nutritional deficiencies from roux en y gastric bypass surgery. Pt is j-tube dependent and receive sole source nutrition via j-tube.    Pt tolerating TF at goal rate: Glucerna 1.5 @ 55 ml/hr.   No new wt since admission.   Pt is followed by palliative care as an outpatient.   Medications reviewed and include calcium carbonate, lovenox, and keppra.   Labs reviewed: Na: 151, CBGS: 124-235 (inpatient orders for glycemic control are 0-9 units insulin aspart every 4 hours, 4 units insulin aspart every 4 hours, and 15 units insulin glargine-yfgn BID).  Noteed DM coordinator recommendations for insulin adjustments.   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  Estimated Nutritional Needs:   Kcal:  1850-2050  Protein:  90-105 grams  Fluid:  > 1.8 L    Levada Schilling, RD, LDN, CDCES Registered Dietitian III Certified Diabetes Care and Education Specialist If unable to reach this RD, please use "RD Inpatient" group chat on secure chat between hours of 8am-4 pm daily

## 2023-08-31 NOTE — Progress Notes (Addendum)
 Progress Note   Patient: Evan Good RUE:[id number] DOB: [birth year] DOA: 08/26/2023     4 DOS: the patient was seen and examined on 08/31/2023   Brief hospital course: 53 y.o. 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   ED course and data review: Tmax 102.1 and tachycardic to 126, tachypneic to 21 with O2 sat 88% on room air improving to low 90s on 2 L Labs notable for WBC 19,000 with lactic acid 3.7 Respiratory viral panel negative Glucose 658 with normal anion gap, potassium 5.5, creatinine 1.97 most recent baseline 0.71 from a couple years prior CT abdomen and pelvis showing considerable bladder wall thickening related to decompression likely, underlying bladder lesion cannot be totally excluded Chest x-ray no active disease Patient treated with sepsis fluids, given insulin 10 units IV and started on Maxipime and vancomycin Patient was noted to have blood at the meatus on physical exam   2/16  : Urine cultures were negative, empiric antibiotics for UTI was stopped.  Patient however had bacteremia positive for staph epi was started on vancomycin.   2/17 : Patient improving, ID consult deemed positive blood culture as contaminants.  Sepsis caused by urinary tract infection.  Rocephin restarted.   Principal Problem:   Severe sepsis (HCC) Active Problems:   Hematuria   AKI (acute kidney injury) (HCC)   Acute metabolic encephalopathy   Uncontrolled type 2 diabetes mellitus with hyperglycemia, with long-term current use of insulin (HCC)   Seizure disorder (HCC)   Gastrostomy status (HCC)   Electrolyte  abnormality   History of anoxic brain injury   Functional quadriplegia (HCC)   Protein-calorie malnutrition, moderate (HCC)   Hypernatremia   Assessment and Plan: Severe sepsis with septic shock. Acute cystitis with hematuria. Present with significant leukocytosis, tachycardia, severe lactic acidosis of 4.9. Etiology is from GU given bladder wall thickening and gross hematuria. So far patient urine culture has been negative, but antibiotics continued. Blood culture was believed to be from contaminants per ID.  Continue Rocephin for now.  Will plan to change to oral antibiotics at time of discharge tomorrow.   AKI (acute kidney injury)  Hypernatremia. Hyperkalemia. 2/18. Acute kidney injury is due to severe sepsis, renal function has improved after fluids.  However, patient continued to have significant hypernatremia, I will discontinue IV fluids with normal saline, increase free water from tube feeding.   2/19.  Sodium level still elevated at 151, given metolazone.  Continue higher dose of free water.  Removed Foley catheter which was placed in the ED.  Bladder scan.     Acute metabolic encephalopathy -improving(likely now at his baseline) Anoxic brain injury. Functional quadriplegia. Mental status improved, but nonverbal.   Uncontrolled type 2 diabetes mellitus with hyperglycemia, with long-term current use of insulin  Close increase better, continue long-acting insulin and sliding scale insulin.   Gastrostomy status  Gastrostomy care   Seizure disorder  Continue Keppra via G-tube    Pressure ulcers: POA Pressure Injury 08/27/23 Heel Right Stage 1 -  Intact skin with non-blanchable redness of a localized area usually over a bony prominence. (Active)  08/27/23 1558  Location: Heel  Location Orientation: Right  Staging: Stage 1 -  Intact  skin with non-blanchable redness of a localized area usually over a bony prominence.  Wound Description (Comments):   Present on  Admission: Yes     Pressure Injury 08/29/23 Foot Right Stage 2 -  Partial thickness loss of dermis presenting as a shallow open injury with a red, pink wound bed without slough. serous filled blister (Active)  08/29/23 1007  Location: Foot  Location Orientation: Right  Staging: Stage 2 -  Partial thickness loss of dermis presenting as a shallow open injury with a red, pink wound bed without slough.  Wound Description (Comments): serous filled blister  Present on Admission: Yes        Subjective:  Is nonverbal.  Physical Exam: Vitals:   08/30/23 1948 08/30/23 2310 08/31/23 0417 08/31/23 1208  BP: 117/76 103/64 122/86 122/88  Pulse: (!) 105 (!) 102 (!) 110 (!) 109  Resp:    15  Temp: 98.4 F (36.9 C) 98.1 F (36.7 C) 98.8 F (37.1 C) 98 F (36.7 C)  TempSrc:      SpO2: 99% 99% 100% 100%  Weight:      Height:       General exam: Appears calm and comfortable  Respiratory system: Clear to auscultation. Respiratory effort normal. Cardiovascular system: S1 & S2 heard, RRR. No JVD, murmurs, rubs, gallops or clicks. No pedal edema. Gastrointestinal system: Abdomen is nondistended, soft and nontender. No organomegaly or masses felt. Normal bowel sounds heard. Central nervous system: Alert and nonverbal. Extremities: Symmetric 5 x 5 power. Skin: No rashes, lesions or ulcers Psychiatry: Flat affect, no agitation.   Data Reviewed:  Labs reviewed.  Family Communication: None  Disposition: Status is: Inpatient Remains inpatient appropriate because: Severity of disease.  IV treatment.     Time spent: 35 minutes  Author: Marrion Coy, MD 08/31/2023 12:35 PM  For on call review [email or web address].

## 2023-09-01 DIAGNOSIS — R6520 Severe sepsis without septic shock: Secondary | ICD-10-CM | POA: Diagnosis not present

## 2023-09-01 DIAGNOSIS — N3001 Acute cystitis with hematuria: Secondary | ICD-10-CM | POA: Diagnosis not present

## 2023-09-01 DIAGNOSIS — A419 Sepsis, unspecified organism: Secondary | ICD-10-CM | POA: Diagnosis not present

## 2023-09-01 DIAGNOSIS — N179 Acute kidney failure, unspecified: Secondary | ICD-10-CM | POA: Diagnosis not present

## 2023-09-01 LAB — PHOSPHORUS: Phosphorus: 2.7 mg/dL (ref 2.5–4.6)

## 2023-09-01 LAB — CBC
HCT: 33.7 % — ABNORMAL LOW (ref 39.0–52.0)
Hemoglobin: 10.3 g/dL — ABNORMAL LOW (ref 13.0–17.0)
MCH: 27.8 pg (ref 26.0–34.0)
MCHC: 30.6 g/dL (ref 30.0–36.0)
MCV: 91.1 fL (ref 80.0–100.0)
Platelets: 133 10*3/uL — ABNORMAL LOW (ref 150–400)
RBC: 3.7 MIL/uL — ABNORMAL LOW (ref 4.22–5.81)
RDW: 14.4 % (ref 11.5–15.5)
WBC: 8.3 10*3/uL (ref 4.0–10.5)
nRBC: 0.4 % — ABNORMAL HIGH (ref 0.0–0.2)

## 2023-09-01 LAB — BASIC METABOLIC PANEL
Anion gap: 7 (ref 5–15)
BUN: 33 mg/dL — ABNORMAL HIGH (ref 6–20)
CO2: 28 mmol/L (ref 22–32)
Calcium: 8.4 mg/dL — ABNORMAL LOW (ref 8.9–10.3)
Chloride: 113 mmol/L — ABNORMAL HIGH (ref 98–111)
Creatinine, Ser: 0.75 mg/dL (ref 0.61–1.24)
GFR, Estimated: >60 mL/min (ref >60)
Glucose, Bld: 153 mg/dL — ABNORMAL HIGH (ref 70–99)
Potassium: 4.1 mmol/L (ref 3.5–5.1)
Sodium: 148 mmol/L — ABNORMAL HIGH (ref 135–145)

## 2023-09-01 LAB — GLUCOSE, CAPILLARY
Glucose-Capillary: 111 mg/dL — ABNORMAL HIGH (ref 70–99)
Glucose-Capillary: 133 mg/dL — ABNORMAL HIGH (ref 70–99)
Glucose-Capillary: 181 mg/dL — ABNORMAL HIGH (ref 70–99)
Glucose-Capillary: 217 mg/dL — ABNORMAL HIGH (ref 70–99)
Glucose-Capillary: 243 mg/dL — ABNORMAL HIGH (ref 70–99)

## 2023-09-01 LAB — MAGNESIUM: Magnesium: 2.3 mg/dL (ref 1.7–2.4)

## 2023-09-01 MED ORDER — ADULT MULTIVITAMIN W/MINERALS CH
1.0000 | ORAL_TABLET | Freq: Two times a day (BID) | ORAL | Status: DC
  Administered 2023-09-01: 1 via ORAL
  Filled 2023-09-01: qty 1

## 2023-09-01 MED ORDER — METOLAZONE 5 MG PO TABS
5.0000 mg | ORAL_TABLET | Freq: Once | ORAL | Status: AC
  Administered 2023-09-01: 5 mg via ORAL
  Filled 2023-09-01: qty 1

## 2023-09-01 MED ORDER — LEVETIRACETAM 500 MG PO TABS: 1000.0000 mg | ORAL_TABLET | Freq: Two times a day (BID) | ORAL | Status: DC

## 2023-09-01 MED ORDER — ALPRAZOLAM 0.25 MG PO TABS: 0.2500 mg | ORAL_TABLET | Freq: Two times a day (BID) | ORAL | 0 refills | Status: DC

## 2023-09-01 MED ORDER — CALCIUM CARBONATE ANTACID 500 MG PO CHEW
1.0000 | CHEWABLE_TABLET | Freq: Three times a day (TID) | ORAL | Status: DC
  Administered 2023-09-01: 200 mg via ORAL
  Filled 2023-09-01: qty 1

## 2023-09-01 MED ORDER — LEVETIRACETAM 1000 MG PO TABS: 1000.0000 mg | ORAL_TABLET | Freq: Two times a day (BID) | ORAL | Status: DC

## 2023-09-01 MED ORDER — FREE WATER: 200.0000 mL | Status: DC

## 2023-09-01 MED ORDER — CEPHALEXIN 500 MG PO CAPS
500.0000 mg | ORAL_CAPSULE | Freq: Three times a day (TID) | ORAL | Status: AC
Start: 2023-09-01 — End: 2023-09-04

## 2023-09-01 MED ORDER — HYDROCODONE-ACETAMINOPHEN 5-325 MG PO TABS: 1.0000 | ORAL_TABLET | Freq: Two times a day (BID) | ORAL | 0 refills | Status: DC | PRN

## 2023-09-01 NOTE — TOC Transition Note (Signed)
 Transition of Care [facility]) - Discharge Note   Patient Details  Name: Evan Good MRN: [id number] Date of Birth: 03/09/1971  Transition of Care [facility]) CM/SW Contact:  Allena Katz, LCSW Phone Number: 09/01/2023, 11:08 AM   Clinical Narrative:   Pt has orders to discharge to [facility]. RN given number for report. Wife left message about discharge. Tanya notified pt going to room 36A. Medical neccesity printed and on the unit.    Final next level of care: Skilled Nursing Facility Barriers to Discharge: Continued Medical Work up   Patient Goals and CMS Choice   CMS Medicare.gov Compare Post Acute Care list provided to:: Patient        Discharge Placement              Patient chooses bed at: [facility] Patient to be transferred to facility by: [facility] Name of family member notified: wife sylvia Patient and family notified of of transfer: 09/01/23  Discharge Plan and Services Additional resources added to the After Visit Summary for       Post Acute Care Choice: Skilled Nursing Facility                               Social Drivers of Health (SDOH) Interventions SDOH Screenings   Food Insecurity: Patient Declined (08/27/2023)  Housing: Patient Declined (08/27/2023)  Transportation Needs: Patient Declined (08/27/2023)  Utilities: Patient Declined (08/27/2023)  Financial Resource Strain: Low Risk  (03/18/2020)   Received from [facility], [facility]  Social Connections: Unknown (08/27/2023)  Tobacco Use: Low Risk  (08/26/2023)     Readmission Risk Interventions     No data to display

## 2023-09-01 NOTE — Progress Notes (Signed)
 Wife Nettie Elm made aware that pt has discharge orders placed.

## 2023-09-01 NOTE — [place]2 (Signed)
 [place] MEDICAID FL2 LEVEL OF CARE FORM     IDENTIFICATION  Patient Name: Evan Good Birthdate: 16-Oct-1970 Sex: adult Admission Date (Current Location): 08/26/2023  [facility] and [place] Number:  Chiropodist and Address:  [facility], [address], [place], [place] [address]      Provider Number: [id number]  Attending Physician Name and Address:  Marrion Coy, MD  Relative Name and Phone Number:       Current Level of Care: Hospital Recommended Level of Care: Skilled Nursing Facility Prior Approval Number:    Date Approved/Denied:   PASRR Number: [id number]  Discharge Plan: SNF    Current Diagnoses: Patient Active Problem List   Diagnosis Date Noted   Thrombocytopenia (HCC) 08/31/2023   Fever 08/31/2023   Urinary tract infection with hematuria 08/31/2023   Hypernatremia 08/30/2023   AKI (acute kidney injury) (HCC) 08/27/2023   Acute metabolic encephalopathy 08/27/2023   Uncontrolled type 2 diabetes mellitus with hyperglycemia, with long-term current use of insulin (HCC) 08/27/2023   Functional quadriplegia (HCC) 08/27/2023   Seizure disorder (HCC) 08/27/2023   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   Hematuria 08/27/2023   Electrolyte abnormality 08/27/2023   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   Cellulitis 12/16/2020   Cutaneous abscesses of both axillae 12/16/2020   History of anoxic brain injury 12/16/2020   Protein-calorie malnutrition, moderate (HCC) 10/28/2020   Severe sepsis (HCC) 10/27/2020   Sepsis associated hypotension (HCC) 10/26/2020   Leukocytosis 10/26/2020   Cystitis 10/26/2020   Total self-care deficit 10/26/2020   Feeding tube blocked 10/26/2020   Pressure injury of skin 10/26/2020   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) 10/26/2020    Orientation RESPIRATION BLADDER Height & Weight          Incontinent Weight: 160 lb (72.6 kg) Height:  5\' 11"  (180.3 cm)  BEHAVIORAL  SYMPTOMS/MOOD NEUROLOGICAL BOWEL NUTRITION STATUS      Incontinent    AMBULATORY STATUS COMMUNICATION OF NEEDS Skin   Extensive Assist Verbally PU Stage and Appropriate Care (multiple PI, see dc summay)                       Personal Care Assistance Level of Assistance  Bathing, Feeding, Dressing Bathing Assistance: Maximum assistance Feeding assistance: Maximum assistance Dressing Assistance: Maximum assistance     Functional Limitations Info             SPECIAL CARE FACTORS FREQUENCY                       Contractures Contractures Info: Not present    Additional Factors Info  Code Status, Isolation Precautions         Isolation Precautions Info: MRSA     Current Medications (09/01/2023):  This is the current hospital active medication list Current Facility-Administered Medications  Medication Dose Route Frequency Provider Last Rate Last Admin   acetaminophen (TYLENOL) tablet 650 mg  650 mg Oral Q6H PRN Andris Baumann, MD       Or   acetaminophen (TYLENOL) suppository 650 mg  650 mg Rectal Q6H PRN Andris Baumann, MD       calcium carbonate (TUMS - dosed in mg elemental calcium) chewable tablet 200 mg of elemental calcium  1 tablet Per Tube TID Kirstie Peri, MD   200 mg of elemental calcium at 09/01/23 0947   cefTRIAXone (ROCEPHIN) 2 g in sodium chloride 0.9 % 100 mL IVPB  2 g Intravenous  Q24H Lynn Ito, MD 200 mL/hr at 08/31/23 2156 2 g at 08/31/23 2156   chlorhexidine (PERIDEX) 0.12 % solution 10 mL  10 mL Mouth/Throat BID Andris Baumann, MD   10 mL at 09/01/23 0946   Chlorhexidine Gluconate Cloth 2 % PADS 6 each  6 each Topical Daily Kirstie Peri, MD   6 each at 09/01/23 0948   enoxaparin (LOVENOX) injection 40 mg  40 mg Subcutaneous Q24H Lindajo Royal V, MD   40 mg at 09/01/23 0946   feeding supplement (GLUCERNA 1.5 CAL) liquid 1,000 mL  1,000 mL Per Tube Continuous Kirstie Peri, MD 55 mL/hr at 08/30/23 0451 Infusion Verify at 08/30/23  0451   free water 200 mL  200 mL Per Tube Q4H Marrion Coy, MD   200 mL at 09/01/23 0800   insulin aspart (novoLOG) injection 0-9 Units  0-9 Units Subcutaneous Q4H Andris Baumann, MD   2 Units at 09/01/23 0948   insulin aspart (novoLOG) injection 4 Units  4 Units Subcutaneous Q4H Kirstie Peri, MD   4 Units at 09/01/23 0947   insulin glargine-yfgn (SEMGLEE) injection 15 Units  15 Units Subcutaneous q morning Kirstie Peri, MD   15 Units at 09/01/23 0946   insulin glargine-yfgn (SEMGLEE) injection 15 Units  15 Units Subcutaneous QHS Marrion Coy, MD   15 Units at 08/31/23 2150   levETIRAcetam (KEPPRA) tablet 1,000 mg  1,000 mg Per Tube BID Lindajo Royal V, MD   1,000 mg at 09/01/23 4540   multivitamin with minerals tablet 1 tablet  1 tablet Per Tube BID WC Marrion Coy, MD   1 tablet at 09/01/23 9811   nutrition supplement (JUVEN) (JUVEN) powder packet 1 packet  1 packet Per Tube BID BM Kirstie Peri, MD   1 packet at 09/01/23 0946   ondansetron [facility]) tablet 4 mg  4 mg Oral Q6H PRN Andris Baumann, MD       Or   ondansetron [facility]) injection 4 mg  4 mg Intravenous Q6H PRN Andris Baumann, MD       pantoprazole (PROTONIX) injection 40 mg  40 mg Intravenous Q24H Andris Baumann, MD   40 mg at 09/01/23 9147   selenium sulfide (SELSUN) 1 % shampoo   Topical Daily Kirstie Peri, MD   Given at 09/01/23 [phone]     Discharge Medications: Please see discharge summary for a list of discharge medications.   STOP taking these medications     ALPRAZolam 0.25 MG tablet Commonly known as: XANAX    DULoxetine 20 MG capsule Commonly known as: CYMBALTA    HYDROcodone-acetaminophen 5-325 MG tablet Commonly known as: NORCO/VICODIN    insulin aspart 100 UNIT/ML injection Commonly known as: novoLOG           TAKE these medications     acetaminophen 325 MG tablet Commonly known as: TYLENOL Take 650 mg by mouth every 4 (four) hours as needed.    cephALEXin 500 MG capsule Commonly known as:  KEFLEX Take 1 capsule (500 mg total) by mouth 3 (three) times daily for 3 days.    chlorhexidine 0.12 % solution Commonly known as: PERIDEX 5 mL by Mouth route two (2) times a day.    Cholecalciferol 25 MCG (1000 UT) tablet 1,000 Units daily. Via g-tube    cyanocobalamin 1000 MCG tablet Commonly known as: VITAMIN B12 Place 1 tablet (1,000 mcg total) into feeding tube daily.    doxycycline 100 MG capsule Commonly known as: VIBRAMYCIN Take 100 mg  by mouth 2 (two) times daily.    famotidine 20 MG tablet Commonly known as: PEPCID 20 mg daily. Via g-tube    feeding supplement (OSMOLITE 1.5 CAL) Liqd Place 1,000 mLs into feeding tube continuous.    feeding supplement (PROSource TF) liquid Place 45 mLs into feeding tube 2 (two) times daily.    Fiasp 100 UNIT/ML Soln Generic drug: Insulin Aspart (w/Niacinamide) Inject 5 Units into the skin 3 (three) times daily before meals.    folic acid 1 MG tablet Commonly known as: FOLVITE 1 mg daily. Via g-tube    free water Soln Place 200 mLs into feeding tube every 4 (four) hours.    insulin glargine 100 UNIT/ML injection Commonly known as: LANTUS Inject 0.25 mLs (25 Units total) into the skin daily. What changed:  how much to take when to take this    levETIRAcetam 100 MG/ML solution Commonly known as: KEPPRA Place 800 mg into feeding tube 2 (two) times daily. What changed: Another medication with the same name was removed. Continue taking this medication, and follow the directions you see here.    melatonin 3 MG Tabs tablet 3 mg at bedtime.    metFORMIN 500 MG tablet Commonly known as: GLUCOPHAGE Place 500 mg into feeding tube 2 (two) times daily.    polyethylene glycol powder 17 GM/SCOOP powder Commonly known as: GLYCOLAX/MIRALAX 17 g by G-tube route two (2) times a day as needed (give throught g-tube).    Probiotic (Lactobacillus) Caps Place 1 capsule into feeding tube 2 (two) times daily.    Sennosides 8.6 MG  Caps Place 8.6 mg into feeding tube in the morning and at bedtime.    sertraline 50 MG tablet Commonly known as: ZOLOFT Take 50 mg by mouth daily.    Super Thera Vite M Tabs Take 1 tablet by mouth daily. Via g-tube    thiamine 100 MG tablet Commonly known as: VITAMIN B1 100 mg daily. Via g-tube    Relevant Imaging Results:  Relevant Lab Results:   Additional Information [id number]  Allena Katz, LCSW

## 2023-09-01 NOTE — Progress Notes (Signed)
 I called report to [facility] at [phone]. I spoke with the nurse [facility] assuming care for patient to room 36A. Pt will be leaving via EMS. Wife notified of transfer.

## 2023-09-01 NOTE — Progress Notes (Signed)
Patient discharged in stable condition via EMS.  

## 2023-09-01 NOTE — Discharge Summary (Signed)
 Physician Discharge Summary   Patient: Evan Good MRN: [id number] DOB: [birth year]  Admit date:     08/26/2023  Discharge date: 09/01/23  Discharge Physician: Marrion Coy   PCP: Patrecia Pour, MD   Recommendations at discharge:    Follow-up with PCP in 1 week. Follow-up with BMP in 5 days. If sodium level normalized, may reduce free water from tube feeding.   Discharge Diagnoses: Principal Problem:   Severe sepsis (HCC) Active Problems:   Hematuria   AKI (acute kidney injury) (HCC)   Acute metabolic encephalopathy   Uncontrolled type 2 diabetes mellitus with hyperglycemia, with long-term current use of insulin (HCC)   Seizure disorder (HCC)   Gastrostomy status (HCC)   Electrolyte abnormality   History of anoxic brain injury   Functional quadriplegia (HCC)   Protein-calorie malnutrition, moderate (HCC)   Hypernatremia   Thrombocytopenia (HCC)   Fever   Urinary tract infection with hematuria  Resolved Problems:   * No resolved hospital problems. *  Hospital Course: 53 y.o. 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   ED course and data review: Tmax 102.1 and tachycardic to 126, tachypneic to 21 with O2 sat 88% on room air improving to low 90s on 2 L Labs notable for WBC 19,000 with lactic acid 3.7 Respiratory viral panel negative Glucose 658 with normal anion gap, potassium 5.5, creatinine 1.97 most recent baseline 0.71 from a couple years prior CT abdomen and pelvis showing considerable bladder wall thickening related to decompression likely, underlying bladder lesion cannot be  totally excluded Chest x-ray no active disease Patient treated with sepsis fluids, given insulin 10 units IV and started on Maxipime and vancomycin Patient was noted to have blood at the meatus on physical exam   2/16  : Urine cultures were negative, empiric antibiotics for UTI was stopped.  Patient however had bacteremia positive for staph epi was started on vancomycin.   2/17 : Patient improving, ID consult deemed positive blood culture as contaminants.  Sepsis caused by urinary tract infection.  Rocephin restarted.  2/20.  Patient has been giving more free water, receiving metolazone for hypernatremia.  Sodium level gradually improving.  Continue increased free water at discharge.  Medically stable.  Assessment and Plan: Severe sepsis with septic shock. Acute cystitis with hematuria. Present with significant leukocytosis, tachycardia, severe lactic acidosis of 4.9. Etiology is from GU given bladder wall thickening and gross hematuria. So far patient urine culture has been negative, but antibiotics continued. Blood culture was believed to be from contaminants per ID.  Treated with Rocephin, will continue 3 days of Keflex after discharge.   AKI (acute kidney injury)  Hypernatremia. Hyperkalemia. 2/18. Acute kidney injury is due to severe sepsis, renal function has improved after fluids.  However, patient continued to have significant hypernatremia, I will discontinue IV fluids with normal saline, increase free water from tube feeding.   2/19.  Sodium level still elevated at 151, given metolazone.  Continue higher dose of free water.  Removed Foley catheter which was placed in the ED.   2/20.  Patient was able to void after removing a Foley catheter.  Sodium level getting better with increased free water and metolazone, give  another dose of metolazone.  Continue increased free water, recheck a BMP in 5 days, may reduce free water amount if sodium level has improved.     Acute metabolic  encephalopathy -improving(likely now at his baseline) Anoxic brain injury. Functional quadriplegia. Mental status improved, but nonverbal.   Uncontrolled type 2 diabetes mellitus with hyperglycemia, with long-term current use of insulin  Resume home dose insulin.   Gastrostomy status  Gastrostomy care   Seizure disorder  Continue Keppra via G-tube   Pressure ulcers POA. Follow with RN Pressure Injury 08/27/23 Heel Right Stage 1 -  Intact skin with non-blanchable redness of a localized area usually over a bony prominence. (Active)  08/27/23 1558  Location: Heel  Location Orientation: Right  Staging: Stage 1 -  Intact skin with non-blanchable redness of a localized area usually over a bony prominence.  Wound Description (Comments):   Present on Admission: Yes     Pressure Injury 08/29/23 Foot Right Stage 2 -  Partial thickness loss of dermis presenting as a shallow open injury with a red, pink wound bed without slough. serous filled blister (Active)  08/29/23 1007  Location: Foot  Location Orientation: Right  Staging: Stage 2 -  Partial thickness loss of dermis presenting as a shallow open injury with a red, pink wound bed without slough.  Wound Description (Comments): serous filled blister  Present on Admission: Yes         Consultants: ID Procedures performed: None  Disposition: Skilled nursing facility Diet recommendation:  Discharge Diet Orders (From admission, onward)     Start     Ordered   09/01/23 0000  Diet general       Comments: Tube feeding as ordered   09/01/23 1000           NPO Tube feeding DISCHARGE MEDICATION: Allergies as of 09/01/2023   No Known Allergies      Medication List     STOP taking these medications    DULoxetine 20 MG capsule Commonly known as: CYMBALTA   insulin aspart 100 UNIT/ML injection Commonly known as: novoLOG       TAKE these medications    acetaminophen 325 MG tablet Commonly known as: TYLENOL Take 650  mg by mouth every 4 (four) hours as needed.   ALPRAZolam 0.25 MG tablet Commonly known as: XANAX Place 1 tablet (0.25 mg total) into feeding tube 2 (two) times daily.   cephALEXin 500 MG capsule Commonly known as: KEFLEX Take 1 capsule (500 mg total) by mouth 3 (three) times daily for 3 days.   chlorhexidine 0.12 % solution Commonly known as: PERIDEX 5 mL by Mouth route two (2) times a day.   Cholecalciferol 25 MCG (1000 UT) tablet 1,000 Units daily. Via g-tube   cyanocobalamin 1000 MCG tablet Commonly known as: VITAMIN B12 Place 1 tablet (1,000 mcg total) into feeding tube daily.   doxycycline 100 MG capsule Commonly known as: VIBRAMYCIN Take 100 mg by mouth 2 (two) times daily.   famotidine 20 MG tablet Commonly known as: PEPCID 20 mg daily. Via g-tube   feeding supplement (OSMOLITE 1.5 CAL) Liqd Place 1,000 mLs into feeding tube continuous.   feeding supplement (PROSource TF) liquid Place 45 mLs into feeding tube 2 (two) times daily.   Fiasp 100 UNIT/ML Soln Generic drug: Insulin Aspart (w/Niacinamide) Inject 5 Units into the skin 3 (three) times daily before meals.   folic acid 1 MG tablet Commonly known as: FOLVITE 1 mg daily. Via g-tube  free water Soln Place 200 mLs into feeding tube every 4 (four) hours.   HYDROcodone-acetaminophen 5-325 MG tablet Commonly known as: NORCO/VICODIN Take 1 tablet by mouth every 12 (twelve) hours as needed for moderate pain (pain score 4-6) or severe pain (pain score 7-10).   insulin glargine 100 UNIT/ML injection Commonly known as: LANTUS Inject 0.25 mLs (25 Units total) into the skin daily. What changed:  how much to take when to take this   levETIRAcetam 100 MG/ML solution Commonly known as: KEPPRA Place 800 mg into feeding tube 2 (two) times daily. What changed: Another medication with the same name was removed. Continue taking this medication, and follow the directions you see here.   melatonin 3 MG Tabs  tablet 3 mg at bedtime.   metFORMIN 500 MG tablet Commonly known as: GLUCOPHAGE Place 500 mg into feeding tube 2 (two) times daily.   polyethylene glycol powder 17 GM/SCOOP powder Commonly known as: GLYCOLAX/MIRALAX 17 g by G-tube route two (2) times a day as needed (give throught g-tube).   Probiotic (Lactobacillus) Caps Place 1 capsule into feeding tube 2 (two) times daily.   Sennosides 8.6 MG Caps Place 8.6 mg into feeding tube in the morning and at bedtime.   sertraline 50 MG tablet Commonly known as: ZOLOFT Take 50 mg by mouth daily.   Super Thera Vite M Tabs Take 1 tablet by mouth daily. Via g-tube   thiamine 100 MG tablet Commonly known as: VITAMIN B1 100 mg daily. Via g-tube               Discharge Care Instructions  (From admission, onward)           Start     Ordered   09/01/23 0000  Discharge wound care:       Comments: 08/30/23 0500    Wound care  Daily      Comments: Apply single layer xeroform to the right plantar surface foot wound, top with foam, making sure to keep pressure off of this area.  Continue to protect heels with foam dressing, assessing under them each shift for acute changes.  Offload patient's heels at all times, high risk for re-breakdown.  08/29/23 1006   08/29/23 0500    Wound care  Daily      Comments: Cleanse axilla and groin wounds with soap and water, pat dry Use silver hydrofiber to absorb exudate in each area. Chronic hydradenitis suppurativa. Use ABD pads and mesh underwear for groin wounds Change daily   09/01/23 1000            Follow-up Information     Shackleford, Lovell Sheehan, MD Follow up in 1 week(s).   Specialty: Internal Medicine Why: Hospital follow up Contact information: [address] [place] [address] [phone]                Discharge Exam: Ceasar Mons Weights   08/26/23 1726  Weight: 72.6 kg   General exam: Appears calm and comfortable  Respiratory system: Clear  to auscultation. Respiratory effort normal. Cardiovascular system: S1 & S2 heard, RRR. No JVD, murmurs, rubs, gallops or clicks. No pedal edema. Gastrointestinal system: Abdomen is nondistended, soft and nontender. No organomegaly or masses felt. Normal bowel sounds heard. Central nervous system: Alert and nonverbal. Extremities: Symmetric 5 x 5 power. Skin: No rashes, lesions or ulcers Psychiatry: Interactive, nonverbal.    Condition at discharge: fair  The results of significant diagnostics from this hospitalization (including imaging, microbiology, ancillary  and laboratory) are listed below for reference.   Imaging Studies: ECHOCARDIOGRAM COMPLETE Result Date: 08/28/2023    ECHOCARDIOGRAM REPORT   Patient Name:   [facility] Date of Exam: 08/28/2023 Medical Rec #:  [id number]                 Height:       71.0 in Accession #:    [id number]                Weight:       160.0 lb Date of Birth:  June 19, 1971                BSA:          1.918 m Patient Age:    52 years                  BP:           113/66 mmHg Patient Gender: M                         HR:           117 bpm. Exam Location:  [facility] Procedure: 2D Echo (Both Spectral and Color Flow Doppler were utilized during            procedure). Indications:     Bacteremia R78.81  History:         Patient has no prior history of Echocardiogram examinations.  Sonographer:     Overton Mam RDCS, FASE Referring Phys:  [id number] Kirstie Peri Diagnosing Phys: Adrian Blackwater IMPRESSIONS  1. Left ventricular ejection fraction, by estimation, is 60 to 65%. The left ventricle has normal function. The left ventricle has no regional wall motion abnormalities. Left ventricular diastolic parameters are consistent with Grade I diastolic dysfunction (impaired relaxation).  2. Right ventricular systolic function is normal. The right ventricular size is normal.  3. The mitral valve is normal in structure. Trivial mitral valve regurgitation. No evidence of  mitral stenosis.  4. The aortic valve is normal in structure. Aortic valve regurgitation is trivial. No aortic stenosis is present.  5. The inferior vena cava is normal in size with greater than 50% respiratory variability, suggesting right atrial pressure of 3 mmHg. FINDINGS  Left Ventricle: Left ventricular ejection fraction, by estimation, is 60 to 65%. The left ventricle has normal function. The left ventricle has no regional wall motion abnormalities. Strain imaging was not performed. The left ventricular internal cavity  size was normal in size. There is no left ventricular hypertrophy. Left ventricular diastolic parameters are consistent with Grade I diastolic dysfunction (impaired relaxation). Right Ventricle: The right ventricular size is normal. No increase in right ventricular wall thickness. Right ventricular systolic function is normal. Left Atrium: Left atrial size was normal in size. Right Atrium: Right atrial size was normal in size. Pericardium: There is no evidence of pericardial effusion. Mitral Valve: The mitral valve is normal in structure. Trivial mitral valve regurgitation. No evidence of mitral valve stenosis. Tricuspid Valve: The tricuspid valve is normal in structure. Tricuspid valve regurgitation is trivial. No evidence of tricuspid stenosis. Aortic Valve: The aortic valve is normal in structure. Aortic valve regurgitation is trivial. No aortic stenosis is present. Aortic valve peak gradient measures 3.7 mmHg. Pulmonic Valve: The pulmonic valve was normal in structure. Pulmonic valve regurgitation is not visualized. No evidence of pulmonic stenosis. Aorta: The aortic root is normal in size and structure. Venous: The inferior  vena cava is normal in size with greater than 50% respiratory variability, suggesting right atrial pressure of 3 mmHg. IAS/Shunts: No atrial level shunt detected by color flow Doppler. Additional Comments: 3D imaging was not performed.  LEFT VENTRICLE PLAX 2D LVIDd:          4.10 cm   Diastology LVIDs:         2.80 cm   LV e' medial:    6.31 cm/s LV PW:         1.10 cm   LV E/e' medial:  6.1 LV IVS:        1.10 cm   LV e' lateral:   7.83 cm/s LVOT diam:     2.50 cm   LV E/e' lateral: 4.9 LV SV:         64 LV SV Index:   33 LVOT Area:     4.91 cm  RIGHT VENTRICLE RV Basal diam:  3.20 cm RV S prime:     11.00 cm/s TAPSE (M-mode): 2.6 cm LEFT ATRIUM             Index        RIGHT ATRIUM          Index LA diam:        2.70 cm 1.41 cm/m   RA Area:     6.73 cm LA Vol (A2C):   23.2 ml 12.10 ml/m  RA Volume:   12.60 ml 6.57 ml/m LA Vol (A4C):   11.6 ml 6.05 ml/m LA Biplane Vol: 18.2 ml 9.49 ml/m  AORTIC VALVE                 PULMONIC VALVE AV Area (Vmax): 3.91 cm     PV Vmax:        0.51 m/s AV Vmax:        96.20 cm/s   PV Peak grad:   1.1 mmHg AV Peak Grad:   3.7 mmHg     RVOT Peak grad: 1 mmHg LVOT Vmax:      76.60 cm/s LVOT Vmean:     50.500 cm/s LVOT VTI:       0.130 m  AORTA Ao Root diam: 3.50 cm Ao Asc diam:  3.10 cm MITRAL VALVE MV Area (PHT): 6.83 cm    SHUNTS MV Decel Time: 111 msec    Systemic VTI:  0.13 m MV E velocity: 38.30 cm/s  Systemic Diam: 2.50 cm MV A velocity: 55.00 cm/s MV E/A ratio:  0.70 Shaukat Khan Electronically signed by Adrian Blackwater Signature Date/Time: 08/28/2023/6:18:17 PM    Final    CT ABDOMEN PELVIS W CONTRAST Result Date: 08/26/2023 CLINICAL DATA:  Abdominal pain EXAM: CT ABDOMEN AND PELVIS WITH CONTRAST TECHNIQUE: Multidetector CT imaging of the abdomen and pelvis was performed using the standard protocol following bolus administration of intravenous contrast. RADIATION DOSE REDUCTION: This exam was performed according to the departmental dose-optimization program which includes automated exposure control, adjustment of the mA and/or kV according to patient size and/or use of iterative reconstruction technique. CONTRAST:  80mL OMNIPAQUE IOHEXOL 300 MG/ML  SOLN COMPARISON:  07/19/2022 FINDINGS: Lower chest: Mild left basilar atelectasis is  noted. Hepatobiliary: Liver is within normal limits. Gallbladder is decompressed and not well visualized. Pancreas: Unremarkable. No pancreatic ductal dilatation or surrounding inflammatory changes. Spleen: Normal in size without focal abnormality. Adrenals/Urinary Tract: Adrenal glands are within normal limits. Kidneys demonstrate a normal enhancement pattern. No renal calculi or obstructive changes are seen. Normal excretion is noted  on delayed images. The bladder is decompressed with considerable wall thickening. This is of uncertain significance given the decompression. Bladder lesion could not be totally excluded on the basis of this exam. Stomach/Bowel: No obstructive or inflammatory changes of colon are noted. Retained fecal material is seen without obstruction. The appendix is within normal limits. Jejunostomy catheter is noted in place. No obstructive changes of the small bowel are noted. Postsurgical changes in the stomach are noted. Vascular/Lymphatic: Aortic atherosclerosis. No enlarged abdominal or pelvic lymph nodes. Reproductive: Prostate is unremarkable. Other: No abdominal wall hernia or abnormality. No abdominopelvic ascites. Musculoskeletal: No acute or significant osseous findings. IMPRESSION: Considerable bladder wall thickening which may be simply related to decompression. Given the history of blood at the penile meatus, underlying bladder lesion cannot be totally excluded. Mild left basilar atelectasis. Electronically Signed   By: Alcide Clever M.D.   On: 08/26/2023 22:20   DG Chest Port 1 View Result Date: 08/26/2023 CLINICAL DATA:  [id number] Sepsis [facility]) [id number] EXAM: PORTABLE CHEST 1 VIEW COMPARISON:  Chest x-ray 12/16/2020 FINDINGS: The heart and mediastinal contours are within normal limits. Low lung volumes. No focal consolidation. No pulmonary edema. No pleural effusion. No pneumothorax. No acute osseous abnormality. IMPRESSION: Low lung volumes with no active disease. Electronically  Signed   By: Tish Frederickson M.D.   On: 08/26/2023 19:37    Microbiology: Results for orders placed or performed during the hospital encounter of 08/26/23  Culture, blood (Routine x 2)     Status: Abnormal   Collection Time: 08/26/23  5:37 PM   Specimen: BLOOD  Result Value Ref Range Status   Specimen Description   Final    BLOOD RIGHT ANTECUBITAL Performed at [facility], [address] [place], [place] [address]    Special Requests   Final    BOTTLES DRAWN AEROBIC AND ANAEROBIC Blood Culture results may not be optimal due to an inadequate volume of blood received in culture bottles Performed at [facility], [address] [place], [place] [address]    Culture  Setup Time   Final    GRAM POSITIVE COCCI IN BOTH AEROBIC AND ANAEROBIC BOTTLES CRITICAL RESULT CALLED TO, READ BACK BY AND VERIFIED WITH: MEGAN NOTCH ON 08/27/23 AT 0757 QSD Performed at [facility], [address] [place], [place] [address]    Culture (A)  Final    STAPHYLOCOCCUS SIMULANS SUSCEPTIBILITIES PERFORMED ON PREVIOUS CULTURE WITHIN THE LAST 5 DAYS. AEROCOCCUS VIRIDANS Standardized susceptibility testing for this organism is not available. STAPHYLOCOCCUS HOMINIS STAPHYLOCOCCUS CAPITIS THE SIGNIFICANCE OF ISOLATING THIS ORGANISM FROM A SINGLE SET OF BLOOD CULTURES WHEN MULTIPLE SETS ARE DRAWN IS UNCERTAIN. PLEASE NOTIFY THE MICROBIOLOGY DEPARTMENT WITHIN ONE WEEK IF SPECIATION AND SENSITIVITIES ARE REQUIRED. Performed at [facility], [address] [place], [place] [address]    Report Status 08/31/2023 FINAL  Final  Culture, blood (Routine x 2)     Status: Abnormal   Collection Time: 08/26/23  5:38 PM   Specimen: BLOOD  Result Value Ref Range Status   Specimen Description   Final    BLOOD BLOOD RIGHT HAND Performed at [facility], [address] [place], [place] [address]    Special Requests   Final    BOTTLES DRAWN AEROBIC AND ANAEROBIC Blood Culture  results may not be optimal due to an inadequate volume of blood received in culture bottles Performed at [facility], [address], [place], [place] [address]    Culture  Setup Time   Final    GRAM POSITIVE COCCI IN BOTH AEROBIC AND ANAEROBIC BOTTLES MEGAN NOTCH ON 08/27/23 AT 0757 QSD    Culture (A)  Final    STAPHYLOCOCCUS SIMULANS AEROCOCCUS VIRIDANS Standardized susceptibility testing for this organism is not available. STAPHYLOCOCCUS COHNII THE SIGNIFICANCE OF ISOLATING THIS ORGANISM FROM A SINGLE SET OF BLOOD CULTURES WHEN MULTIPLE SETS ARE DRAWN IS UNCERTAIN. PLEASE NOTIFY THE MICROBIOLOGY DEPARTMENT WITHIN ONE WEEK IF SPECIATION AND SENSITIVITIES ARE REQUIRED. Performed at [facility], [address] [place], [place] [address]    Report Status 08/31/2023 FINAL  Final   Organism ID, Bacteria STAPHYLOCOCCUS SIMULANS  Final      Susceptibility   Staphylococcus simulans - MIC*    CIPROFLOXACIN 2 INTERMEDIATE Intermediate     ERYTHROMYCIN >=8 RESISTANT Resistant     GENTAMICIN <=0.5 SENSITIVE Sensitive     OXACILLIN SENSITIVE Sensitive     TETRACYCLINE >=16 RESISTANT Resistant     VANCOMYCIN <=0.5 SENSITIVE Sensitive     TRIMETH/SULFA <=10 SENSITIVE Sensitive     CLINDAMYCIN >=8 RESISTANT Resistant     RIFAMPIN <=0.5 SENSITIVE Sensitive     Inducible Clindamycin NEGATIVE Sensitive     * STAPHYLOCOCCUS SIMULANS  Resp panel by RT-PCR (RSV, Flu A&B, Covid) Anterior Nasal Swab     Status: None   Collection Time: 08/26/23  5:38 PM   Specimen: Anterior Nasal Swab  Result Value Ref Range Status   SARS Coronavirus 2 by RT PCR NEGATIVE NEGATIVE Final    Comment: (NOTE) SARS-CoV-2 target nucleic acids are NOT DETECTED.  The SARS-CoV-2 RNA is generally detectable in upper respiratory specimens during the acute phase of infection. The lowest concentration of SARS-CoV-2 viral copies this assay can detect is 138 copies/mL. A negative result does not preclude  SARS-Cov-2 infection and should not be used as the sole basis for treatment or other patient management decisions. A negative result may occur with  improper specimen collection/handling, submission of specimen other than nasopharyngeal swab, presence of viral mutation(s) within the areas targeted by this assay, and inadequate number of viral copies(<138 copies/mL). A negative result must be combined with clinical observations, patient history, and epidemiological information. The expected result is Negative.  Fact Sheet for Patients:  BloggerCourse.com  Fact Sheet for Healthcare Providers:  SeriousBroker.it  This test is no t yet approved or cleared by the [place] FDA and  has been authorized for detection and/or diagnosis of SARS-CoV-2 by FDA under an Emergency Use Authorization (EUA). This EUA will remain  in effect (meaning this test can be used) for the duration of the COVID-19 declaration under Section 564(b)(1) of the Act, 21 U.S.C.section 360bbb-3(b)(1), unless the authorization is terminated  or revoked sooner.       Influenza A by PCR NEGATIVE NEGATIVE Final   Influenza B by PCR NEGATIVE NEGATIVE Final    Comment: (NOTE) The Xpert Xpress SARS-CoV-2/FLU/RSV plus assay is intended as an aid in the diagnosis of influenza from Nasopharyngeal swab specimens and should not be used as a sole basis for treatment. Nasal washings and aspirates are unacceptable for Xpert Xpress SARS-CoV-2/FLU/RSV testing.  Fact Sheet for Patients: BloggerCourse.com  Fact Sheet for Healthcare Providers: SeriousBroker.it  This test is not yet approved or cleared by the [place] FDA and has been authorized for detection and/or diagnosis of SARS-CoV-2 by FDA under an Emergency Use Authorization (EUA). This EUA will remain in effect (meaning this test can be used) for the duration of  the COVID-19 declaration under Section 564(b)(1) of the Act, 21 U.S.C. section 360bbb-3(b)(1), unless the authorization is terminated or revoked.     Resp Syncytial Virus by PCR NEGATIVE NEGATIVE Final    Comment: (NOTE) Fact Sheet for Patients: BloggerCourse.com  Fact Sheet for Healthcare Providers: SeriousBroker.it  This test is not yet approved or cleared by the [place] FDA and has been authorized for detection and/or diagnosis of SARS-CoV-2 by FDA under an Emergency Use Authorization (EUA). This EUA will remain in effect (meaning this test can be used) for the duration of the COVID-19 declaration under Section 564(b)(1) of the Act, 21 U.S.C. section 360bbb-3(b)(1), unless the authorization is terminated or revoked.  Performed at [facility], [address] [place], [place] [address]   Blood Culture ID Panel (Reflexed)     Status: Abnormal   Collection Time: 08/26/23  5:38 PM  Result Value Ref Range Status   Enterococcus faecalis NOT DETECTED NOT DETECTED Final   Enterococcus Faecium NOT DETECTED NOT DETECTED Final   Listeria monocytogenes NOT DETECTED NOT DETECTED Final   Staphylococcus species DETECTED (A) NOT DETECTED Final    Comment: CRITICAL RESULT CALLED TO, READ BACK BY AND VERIFIED WITH: MEGAN NOTCH ON 08/27/23 AT 0757 QSD    Staphylococcus aureus (BCID) NOT DETECTED NOT DETECTED Final   Staphylococcus epidermidis DETECTED (A) NOT DETECTED Final    Comment: Methicillin (oxacillin) resistant coagulase negative staphylococcus. Possible blood culture contaminant (unless isolated from more than one blood culture draw or clinical case suggests pathogenicity). No antibiotic treatment is indicated for blood  culture contaminants. CRITICAL RESULT CALLED TO, READ BACK BY AND VERIFIED WITH: MEGAN NOTCH ON 08/27/23 AT 0757 QSD    Staphylococcus lugdunensis NOT DETECTED NOT DETECTED Final   Streptococcus  species NOT DETECTED NOT DETECTED Final   Streptococcus agalactiae NOT DETECTED NOT DETECTED Final   Streptococcus pneumoniae NOT DETECTED NOT DETECTED Final   Streptococcus pyogenes NOT DETECTED NOT DETECTED Final   A.calcoaceticus-baumannii NOT DETECTED NOT DETECTED Final   Bacteroides fragilis NOT DETECTED NOT DETECTED Final   Enterobacterales NOT DETECTED NOT DETECTED Final   Enterobacter cloacae complex NOT DETECTED NOT DETECTED Final   Escherichia coli NOT DETECTED NOT DETECTED Final   Klebsiella aerogenes NOT DETECTED NOT DETECTED Final   Klebsiella oxytoca NOT DETECTED NOT DETECTED Final   Klebsiella pneumoniae NOT DETECTED NOT DETECTED Final   Proteus species NOT DETECTED NOT DETECTED Final   Salmonella species NOT DETECTED NOT DETECTED Final   Serratia marcescens NOT DETECTED NOT DETECTED Final   Haemophilus influenzae NOT DETECTED NOT DETECTED Final   Neisseria meningitidis NOT DETECTED NOT DETECTED Final   Pseudomonas aeruginosa NOT DETECTED NOT DETECTED Final   Stenotrophomonas maltophilia NOT DETECTED NOT DETECTED Final   Candida albicans NOT DETECTED NOT DETECTED Final   Candida auris NOT DETECTED NOT DETECTED Final   Candida glabrata NOT DETECTED NOT DETECTED Final   Candida krusei NOT DETECTED NOT DETECTED Final   Candida parapsilosis NOT DETECTED NOT DETECTED Final   Candida tropicalis NOT DETECTED NOT DETECTED Final   Cryptococcus neoformans/gattii NOT DETECTED NOT DETECTED Final   Methicillin resistance mecA/C DETECTED (A) NOT DETECTED Final    Comment: CRITICAL RESULT CALLED TO, READ BACK BY AND VERIFIED WITH: MEGAN NOTCH ON 08/27/23 AT 0757 QSD Performed at [facility], [address], [place], [place] [address]   Urine Culture     Status: None   Collection Time: 08/27/23  3:20 AM   Specimen: Urine, Random  Result Value Ref Range Status   Specimen Description   Final    URINE, RANDOM Performed at [facility], [address]  [place], [place] [address]    Special Requests   Final    NONE Reflexed from [id number] Performed at [facility], [address] [place], [place] [address]    Culture   Final    NO GROWTH Performed at [facility], [address], [place], [place] [address]    Report Status 08/28/2023 FINAL  Final    Labs: CBC: Recent Labs  Lab 08/26/23 1737 08/28/23 0955 08/30/23 0515 08/31/23 0544 09/01/23 0501  WBC 19.3* 7.5 9.4 9.0 8.3  NEUTROABS 13.5*  --   --   --   --   HGB 13.6 12.4* 9.7* 9.8* 10.3*  HCT 45.6 41.7 32.6* 32.9* 33.7*  MCV 91.8 91.9 92.6 93.2 91.1  PLT 249 156 151 138* 133*   Basic Metabolic Panel: Recent Labs  Lab 08/27/23 1613 08/28/23 0955 08/29/23 0911 08/30/23 0515 08/31/23 0544 09/01/23 0501  NA 155* 153* 152*  --  151* 148*  K 4.1 4.4 4.2  --  4.2 4.1  CL 117* 118* 120*  --  117* 113*  CO2 27 26 25   --  26 28  GLUCOSE 335* 352* 332*  --  241* 153*  BUN 75* 57* 42*  --  29* 33*  CREATININE 1.11 1.06 0.85 0.74 0.71 0.75  CALCIUM 9.3 8.9 8.3*  --  8.1* 8.4*  MG  --   --  2.3  --  2.2 2.3  PHOS  --   --   --   --  2.5 2.7   Liver Function Tests: Recent Labs  Lab 08/26/23 1737 08/28/23 0955 08/29/23 0911  AST 32 15 19  ALT 63* 34 29  ALKPHOS 131* 94 114  BILITOT 0.6 0.7 0.5  PROT 8.2* 6.4* 6.6  ALBUMIN 3.1* 2.5* 2.4*   CBG: Recent Labs  Lab 08/31/23 1958 09/01/23 0033 09/01/23 0428 09/01/23 0829 09/01/23 1148  GLUCAP 164* 111* 133* 181* 243*    Discharge time spent: greater than 30 minutes.  Signed: Marrion Coy, MD [facility] 09/01/2023

## 2023-09-01 NOTE — Progress Notes (Signed)
 Patient continues to await discharge via EMS

## 2023-09-01 NOTE — Plan of Care (Signed)
  Problem: Education: Goal: Ability to describe self-care measures that may prevent or decrease complications (Diabetes Survival Skills Education) will improve Outcome: Progressing   Problem: Coping: Goal: Ability to adjust to condition or change in health will improve Outcome: Progressing   Problem: Fluid Volume: Goal: Ability to maintain a balanced intake and output will improve Outcome: Progressing   Problem: Metabolic: Goal: Ability to maintain appropriate glucose levels will improve Outcome: Progressing   Problem: Nutritional: Goal: Maintenance of adequate nutrition will improve Outcome: Progressing   Problem: Respiratory: Goal: Ability to maintain adequate ventilation will improve Outcome: Progressing

## 2023-09-01 NOTE — Discharge Summary (Deleted)
 Physician Discharge Summary   Patient: Evan Good MRN: [id number] DOB: 08-19-1970  Admit date:     08/26/2023  Discharge date: 09/01/23  Discharge Physician: Marrion Coy   PCP: Patrecia Pour, MD   Recommendations at discharge:   Follow-up with PCP in 1 week. Follow-up with BMP in 5 days. If sodium level normalized, may reduce free water from tube feeding.  Discharge Diagnoses: Principal Problem:   Severe sepsis (HCC) Active Problems:   Hematuria   AKI (acute kidney injury) (HCC)   Acute metabolic encephalopathy   Uncontrolled type 2 diabetes mellitus with hyperglycemia, with long-term current use of insulin (HCC)   Seizure disorder (HCC)   Gastrostomy status (HCC)   Electrolyte abnormality   History of anoxic brain injury   Functional quadriplegia (HCC)   Protein-calorie malnutrition, moderate (HCC)   Hypernatremia   Thrombocytopenia (HCC)   Fever   Urinary tract infection with hematuria  Resolved Problems:   * No resolved hospital problems. *  Hospital Course: 53 y.o. 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   ED course and data review: Tmax 102.1 and tachycardic to 126, tachypneic to 21 with O2 sat 88% on room air improving to low 90s on 2 L Labs notable for WBC 19,000 with lactic acid 3.7 Respiratory viral panel negative Glucose 658 with normal anion gap, potassium 5.5, creatinine 1.97 most recent baseline 0.71 from a couple years prior CT abdomen and pelvis showing considerable bladder wall thickening related to decompression likely, underlying bladder lesion cannot be  totally excluded Chest x-ray no active disease Patient treated with sepsis fluids, given insulin 10 units IV and started on Maxipime and vancomycin Patient was noted to have blood at the meatus on physical exam   2/16  : Urine cultures were negative, empiric antibiotics for UTI was stopped.  Patient however had bacteremia positive for staph epi was started on vancomycin.   2/17 : Patient improving, ID consult deemed positive blood culture as contaminants.  Sepsis caused by urinary tract infection.  Rocephin restarted.  2/20.  Patient has been giving more free water, receiving metolazone for hypernatremia.  Sodium level gradually improving.  Continue increased free water at discharge.  Medically stable.  Assessment and Plan: Severe sepsis with septic shock. Acute cystitis with hematuria. Present with significant leukocytosis, tachycardia, severe lactic acidosis of 4.9. Etiology is from GU given bladder wall thickening and gross hematuria. So far patient urine culture has been negative, but antibiotics continued. Blood culture was believed to be from contaminants per ID.  Treated with Rocephin, will continue 3 days of Keflex after discharge.   AKI (acute kidney injury)  Hypernatremia. Hyperkalemia. 2/18. Acute kidney injury is due to severe sepsis, renal function has improved after fluids.  However, patient continued to have significant hypernatremia, I will discontinue IV fluids with normal saline, increase free water from tube feeding.   2/19.  Sodium level still elevated at 151, given metolazone.  Continue higher dose of free water.  Removed Foley catheter which was placed in the ED.   2/20.  Patient was able to void after removing a Foley catheter.  Sodium level getting better with increased free water and metolazone, give another dose  of metolazone.  Continue increased free water, recheck a BMP in 5 days, may reduce free water amount if sodium level has improved.     Acute metabolic  encephalopathy -improving(likely now at his baseline) Anoxic brain injury. Functional quadriplegia. Mental status improved, but nonverbal.   Uncontrolled type 2 diabetes mellitus with hyperglycemia, with long-term current use of insulin  Resume home dose insulin.   Gastrostomy status  Gastrostomy care   Seizure disorder  Continue Keppra via G-tube  Pressure ulcers POA. Follow with RN Pressure Injury 08/27/23 Heel Right Stage 1 -  Intact skin with non-blanchable redness of a localized area usually over a bony prominence. (Active)  08/27/23 1558  Location: Heel  Location Orientation: Right  Staging: Stage 1 -  Intact skin with non-blanchable redness of a localized area usually over a bony prominence.  Wound Description (Comments):   Present on Admission: Yes     Pressure Injury 08/29/23 Foot Right Stage 2 -  Partial thickness loss of dermis presenting as a shallow open injury with a red, pink wound bed without slough. serous filled blister (Active)  08/29/23 1007  Location: Foot  Location Orientation: Right  Staging: Stage 2 -  Partial thickness loss of dermis presenting as a shallow open injury with a red, pink wound bed without slough.  Wound Description (Comments): serous filled blister  Present on Admission: Yes         Consultants: ID Procedures performed: None  Disposition: Skilled nursing facility Diet recommendation:  Discharge Diet Orders (From admission, onward)     Start     Ordered   09/01/23 0000  Diet general       Comments: Tube feeding as ordered   09/01/23 1000           NPO Tube feeding DISCHARGE MEDICATION: Allergies as of 09/01/2023   No Known Allergies      Medication List     STOP taking these medications    ALPRAZolam 0.25 MG tablet Commonly known as: XANAX   DULoxetine 20 MG capsule Commonly known as: CYMBALTA   HYDROcodone-acetaminophen 5-325 MG tablet Commonly known as: NORCO/VICODIN   insulin aspart 100 UNIT/ML  injection Commonly known as: novoLOG       TAKE these medications    acetaminophen 325 MG tablet Commonly known as: TYLENOL Take 650 mg by mouth every 4 (four) hours as needed.   cephALEXin 500 MG capsule Commonly known as: KEFLEX Take 1 capsule (500 mg total) by mouth 3 (three) times daily for 3 days.   chlorhexidine 0.12 % solution Commonly known as: PERIDEX 5 mL by Mouth route two (2) times a day.   Cholecalciferol 25 MCG (1000 UT) tablet 1,000 Units daily. Via g-tube   cyanocobalamin 1000 MCG tablet Commonly known as: VITAMIN B12 Place 1 tablet (1,000 mcg total) into feeding tube daily.   doxycycline 100 MG capsule Commonly known as: VIBRAMYCIN Take 100 mg by mouth 2 (two) times daily.   famotidine 20 MG tablet Commonly known as: PEPCID 20 mg daily. Via g-tube   feeding supplement (OSMOLITE 1.5 CAL) Liqd Place 1,000 mLs into feeding tube continuous.   feeding supplement (PROSource TF) liquid Place 45 mLs into feeding tube 2 (two) times daily.   Fiasp 100 UNIT/ML Soln Generic drug: Insulin Aspart (w/Niacinamide) Inject 5 Units into the skin 3 (three) times daily before meals.   folic acid 1 MG tablet Commonly known as: FOLVITE 1 mg daily. Via g-tube   free water Soln Place 200 mLs  into feeding tube every 4 (four) hours.   insulin glargine 100 UNIT/ML injection Commonly known as: LANTUS Inject 0.25 mLs (25 Units total) into the skin daily. What changed:  how much to take when to take this   levETIRAcetam 100 MG/ML solution Commonly known as: KEPPRA Place 800 mg into feeding tube 2 (two) times daily. What changed: Another medication with the same name was removed. Continue taking this medication, and follow the directions you see here.   melatonin 3 MG Tabs tablet 3 mg at bedtime.   metFORMIN 500 MG tablet Commonly known as: GLUCOPHAGE Place 500 mg into feeding tube 2 (two) times daily.   polyethylene glycol powder 17 GM/SCOOP powder Commonly  known as: GLYCOLAX/MIRALAX 17 g by G-tube route two (2) times a day as needed (give throught g-tube).   Probiotic (Lactobacillus) Caps Place 1 capsule into feeding tube 2 (two) times daily.   Sennosides 8.6 MG Caps Place 8.6 mg into feeding tube in the morning and at bedtime.   sertraline 50 MG tablet Commonly known as: ZOLOFT Take 50 mg by mouth daily.   Super Thera Vite M Tabs Take 1 tablet by mouth daily. Via g-tube   thiamine 100 MG tablet Commonly known as: VITAMIN B1 100 mg daily. Via g-tube               Discharge Care Instructions  (From admission, onward)           Start     Ordered   09/01/23 0000  Discharge wound care:       Comments: 08/30/23 0500    Wound care  Daily      Comments: Apply single layer xeroform to the right plantar surface foot wound, top with foam, making sure to keep pressure off of this area.  Continue to protect heels with foam dressing, assessing under them each shift for acute changes.  Offload patient's heels at all times, high risk for re-breakdown.  08/29/23 1006   08/29/23 0500    Wound care  Daily      Comments: Cleanse axilla and groin wounds with soap and water, pat dry Use silver hydrofiber to absorb exudate in each area. Chronic hydradenitis suppurativa. Use ABD pads and mesh underwear for groin wounds Change daily   09/01/23 1000            Follow-up Information     Shackleford, Lovell Sheehan, MD Follow up in 1 week(s).   Specialty: Internal Medicine Why: Hospital follow up Contact information: [address] [place] [address] [phone]                Discharge Exam: Ceasar Mons Weights   08/26/23 1726  Weight: 72.6 kg   General exam: Appears calm and comfortable  Respiratory system: Clear to auscultation. Respiratory effort normal. Cardiovascular system: S1 & S2 heard, RRR. No JVD, murmurs, rubs, gallops or clicks. No pedal edema. Gastrointestinal system: Abdomen is nondistended,  soft and nontender. No organomegaly or masses felt. Normal bowel sounds heard. Central nervous system: Alert and nonverbal. Extremities: Symmetric 5 x 5 power. Skin: No rashes, lesions or ulcers Psychiatry: Interactive, nonverbal.   Condition at discharge: fair  The results of significant diagnostics from this hospitalization (including imaging, microbiology, ancillary and laboratory) are listed below for reference.   Imaging Studies: ECHOCARDIOGRAM COMPLETE Result Date: 08/28/2023    ECHOCARDIOGRAM REPORT   Patient Name:   [facility] Date of Exam: 08/28/2023 Medical Rec #:  [id number]  Height:       71.0 in Accession #:    [id number]                Weight:       160.0 lb Date of Birth:  09/20/1970                BSA:          1.918 m Patient Age:    52 years                  BP:           113/66 mmHg Patient Gender: M                         HR:           117 bpm. Exam Location:  [facility] Procedure: 2D Echo (Both Spectral and Color Flow Doppler were utilized during            procedure). Indications:     Bacteremia R78.81  History:         Patient has no prior history of Echocardiogram examinations.  Sonographer:     Overton Mam RDCS, FASE Referring Phys:  [id number] Kirstie Peri Diagnosing Phys: Adrian Blackwater IMPRESSIONS  1. Left ventricular ejection fraction, by estimation, is 60 to 65%. The left ventricle has normal function. The left ventricle has no regional wall motion abnormalities. Left ventricular diastolic parameters are consistent with Grade I diastolic dysfunction (impaired relaxation).  2. Right ventricular systolic function is normal. The right ventricular size is normal.  3. The mitral valve is normal in structure. Trivial mitral valve regurgitation. No evidence of mitral stenosis.  4. The aortic valve is normal in structure. Aortic valve regurgitation is trivial. No aortic stenosis is present.  5. The inferior vena cava is normal in size with greater than 50%  respiratory variability, suggesting right atrial pressure of 3 mmHg. FINDINGS  Left Ventricle: Left ventricular ejection fraction, by estimation, is 60 to 65%. The left ventricle has normal function. The left ventricle has no regional wall motion abnormalities. Strain imaging was not performed. The left ventricular internal cavity  size was normal in size. There is no left ventricular hypertrophy. Left ventricular diastolic parameters are consistent with Grade I diastolic dysfunction (impaired relaxation). Right Ventricle: The right ventricular size is normal. No increase in right ventricular wall thickness. Right ventricular systolic function is normal. Left Atrium: Left atrial size was normal in size. Right Atrium: Right atrial size was normal in size. Pericardium: There is no evidence of pericardial effusion. Mitral Valve: The mitral valve is normal in structure. Trivial mitral valve regurgitation. No evidence of mitral valve stenosis. Tricuspid Valve: The tricuspid valve is normal in structure. Tricuspid valve regurgitation is trivial. No evidence of tricuspid stenosis. Aortic Valve: The aortic valve is normal in structure. Aortic valve regurgitation is trivial. No aortic stenosis is present. Aortic valve peak gradient measures 3.7 mmHg. Pulmonic Valve: The pulmonic valve was normal in structure. Pulmonic valve regurgitation is not visualized. No evidence of pulmonic stenosis. Aorta: The aortic root is normal in size and structure. Venous: The inferior vena cava is normal in size with greater than 50% respiratory variability, suggesting right atrial pressure of 3 mmHg. IAS/Shunts: No atrial level shunt detected by color flow Doppler. Additional Comments: 3D imaging was not performed.  LEFT VENTRICLE PLAX 2D LVIDd:         4.10 cm   Diastology  LVIDs:         2.80 cm   LV e' medial:    6.31 cm/s LV PW:         1.10 cm   LV E/e' medial:  6.1 LV IVS:        1.10 cm   LV e' lateral:   7.83 cm/s LVOT diam:     2.50 cm    LV E/e' lateral: 4.9 LV SV:         64 LV SV Index:   33 LVOT Area:     4.91 cm  RIGHT VENTRICLE RV Basal diam:  3.20 cm RV S prime:     11.00 cm/s TAPSE (M-mode): 2.6 cm LEFT ATRIUM             Index        RIGHT ATRIUM          Index LA diam:        2.70 cm 1.41 cm/m   RA Area:     6.73 cm LA Vol (A2C):   23.2 ml 12.10 ml/m  RA Volume:   12.60 ml 6.57 ml/m LA Vol (A4C):   11.6 ml 6.05 ml/m LA Biplane Vol: 18.2 ml 9.49 ml/m  AORTIC VALVE                 PULMONIC VALVE AV Area (Vmax): 3.91 cm     PV Vmax:        0.51 m/s AV Vmax:        96.20 cm/s   PV Peak grad:   1.1 mmHg AV Peak Grad:   3.7 mmHg     RVOT Peak grad: 1 mmHg LVOT Vmax:      76.60 cm/s LVOT Vmean:     50.500 cm/s LVOT VTI:       0.130 m  AORTA Ao Root diam: 3.50 cm Ao Asc diam:  3.10 cm MITRAL VALVE MV Area (PHT): 6.83 cm    SHUNTS MV Decel Time: 111 msec    Systemic VTI:  0.13 m MV E velocity: 38.30 cm/s  Systemic Diam: 2.50 cm MV A velocity: 55.00 cm/s MV E/A ratio:  0.70 Shaukat Khan Electronically signed by Adrian Blackwater Signature Date/Time: 08/28/2023/6:18:17 PM    Final    CT ABDOMEN PELVIS W CONTRAST Result Date: 08/26/2023 CLINICAL DATA:  Abdominal pain EXAM: CT ABDOMEN AND PELVIS WITH CONTRAST TECHNIQUE: Multidetector CT imaging of the abdomen and pelvis was performed using the standard protocol following bolus administration of intravenous contrast. RADIATION DOSE REDUCTION: This exam was performed according to the departmental dose-optimization program which includes automated exposure control, adjustment of the mA and/or kV according to patient size and/or use of iterative reconstruction technique. CONTRAST:  80mL OMNIPAQUE IOHEXOL 300 MG/ML  SOLN COMPARISON:  07/19/2022 FINDINGS: Lower chest: Mild left basilar atelectasis is noted. Hepatobiliary: Liver is within normal limits. Gallbladder is decompressed and not well visualized. Pancreas: Unremarkable. No pancreatic ductal dilatation or surrounding inflammatory changes.  Spleen: Normal in size without focal abnormality. Adrenals/Urinary Tract: Adrenal glands are within normal limits. Kidneys demonstrate a normal enhancement pattern. No renal calculi or obstructive changes are seen. Normal excretion is noted on delayed images. The bladder is decompressed with considerable wall thickening. This is of uncertain significance given the decompression. Bladder lesion could not be totally excluded on the basis of this exam. Stomach/Bowel: No obstructive or inflammatory changes of colon are noted. Retained fecal material is seen without obstruction. The appendix is within normal limits.  Jejunostomy catheter is noted in place. No obstructive changes of the small bowel are noted. Postsurgical changes in the stomach are noted. Vascular/Lymphatic: Aortic atherosclerosis. No enlarged abdominal or pelvic lymph nodes. Reproductive: Prostate is unremarkable. Other: No abdominal wall hernia or abnormality. No abdominopelvic ascites. Musculoskeletal: No acute or significant osseous findings. IMPRESSION: Considerable bladder wall thickening which may be simply related to decompression. Given the history of blood at the penile meatus, underlying bladder lesion cannot be totally excluded. Mild left basilar atelectasis. Electronically Signed   By: Alcide Clever M.D.   On: 08/26/2023 22:20   DG Chest Port 1 View Result Date: 08/26/2023 CLINICAL DATA:  [id number] Sepsis [facility]) [id number] EXAM: PORTABLE CHEST 1 VIEW COMPARISON:  Chest x-ray 12/16/2020 FINDINGS: The heart and mediastinal contours are within normal limits. Low lung volumes. No focal consolidation. No pulmonary edema. No pleural effusion. No pneumothorax. No acute osseous abnormality. IMPRESSION: Low lung volumes with no active disease. Electronically Signed   By: Tish Frederickson M.D.   On: 08/26/2023 19:37    Microbiology: Results for orders placed or performed during the hospital encounter of 08/26/23  Culture, blood (Routine x 2)     Status:  Abnormal   Collection Time: 08/26/23  5:37 PM   Specimen: BLOOD  Result Value Ref Range Status   Specimen Description   Final    BLOOD RIGHT ANTECUBITAL Performed at [facility], [address] [place], [place] [address]    Special Requests   Final    BOTTLES DRAWN AEROBIC AND ANAEROBIC Blood Culture results may not be optimal due to an inadequate volume of blood received in culture bottles Performed at [facility], [address], [place], [place] [address]    Culture  Setup Time   Final    GRAM POSITIVE COCCI IN BOTH AEROBIC AND ANAEROBIC BOTTLES CRITICAL RESULT CALLED TO, READ BACK BY AND VERIFIED WITH: MEGAN NOTCH ON 08/27/23 AT 0757 QSD Performed at [facility], [address] [place], [place] [address]    Culture (A)  Final    STAPHYLOCOCCUS SIMULANS SUSCEPTIBILITIES PERFORMED ON PREVIOUS CULTURE WITHIN THE LAST 5 DAYS. AEROCOCCUS VIRIDANS Standardized susceptibility testing for this organism is not available. STAPHYLOCOCCUS HOMINIS STAPHYLOCOCCUS CAPITIS THE SIGNIFICANCE OF ISOLATING THIS ORGANISM FROM A SINGLE SET OF BLOOD CULTURES WHEN MULTIPLE SETS ARE DRAWN IS UNCERTAIN. PLEASE NOTIFY THE MICROBIOLOGY DEPARTMENT WITHIN ONE WEEK IF SPECIATION AND SENSITIVITIES ARE REQUIRED. Performed at [facility], [address] [place], [place] [address]    Report Status 08/31/2023 FINAL  Final  Culture, blood (Routine x 2)     Status: Abnormal   Collection Time: 08/26/23  5:38 PM   Specimen: BLOOD  Result Value Ref Range Status   Specimen Description   Final    BLOOD BLOOD RIGHT HAND Performed at [facility], [address] [place], [place] [address]    Special Requests   Final    BOTTLES DRAWN AEROBIC AND ANAEROBIC Blood Culture results may not be optimal due to an inadequate volume of blood received in culture bottles Performed at [facility], [address] [place], [place] [address]    Culture  Setup Time    Final    GRAM POSITIVE COCCI IN BOTH AEROBIC AND ANAEROBIC BOTTLES MEGAN NOTCH ON 08/27/23 AT 0757 QSD    Culture (A)  Final    STAPHYLOCOCCUS SIMULANS AEROCOCCUS VIRIDANS Standardized susceptibility testing for this organism is not available. STAPHYLOCOCCUS COHNII THE SIGNIFICANCE OF ISOLATING THIS  ORGANISM FROM A SINGLE SET OF BLOOD CULTURES WHEN MULTIPLE SETS ARE DRAWN IS UNCERTAIN. PLEASE NOTIFY THE MICROBIOLOGY DEPARTMENT WITHIN ONE WEEK IF SPECIATION AND SENSITIVITIES ARE REQUIRED. Performed at [facility], [address] [place], [place] [address]    Report Status 08/31/2023 FINAL  Final   Organism ID, Bacteria STAPHYLOCOCCUS SIMULANS  Final      Susceptibility   Staphylococcus simulans - MIC*    CIPROFLOXACIN 2 INTERMEDIATE Intermediate     ERYTHROMYCIN >=8 RESISTANT Resistant     GENTAMICIN <=0.5 SENSITIVE Sensitive     OXACILLIN SENSITIVE Sensitive     TETRACYCLINE >=16 RESISTANT Resistant     VANCOMYCIN <=0.5 SENSITIVE Sensitive     TRIMETH/SULFA <=10 SENSITIVE Sensitive     CLINDAMYCIN >=8 RESISTANT Resistant     RIFAMPIN <=0.5 SENSITIVE Sensitive     Inducible Clindamycin NEGATIVE Sensitive     * STAPHYLOCOCCUS SIMULANS  Resp panel by RT-PCR (RSV, Flu A&B, Covid) Anterior Nasal Swab     Status: None   Collection Time: 08/26/23  5:38 PM   Specimen: Anterior Nasal Swab  Result Value Ref Range Status   SARS Coronavirus 2 by RT PCR NEGATIVE NEGATIVE Final    Comment: (NOTE) SARS-CoV-2 target nucleic acids are NOT DETECTED.  The SARS-CoV-2 RNA is generally detectable in upper respiratory specimens during the acute phase of infection. The lowest concentration of SARS-CoV-2 viral copies this assay can detect is 138 copies/mL. A negative result does not preclude SARS-Cov-2 infection and should not be used as the sole basis for treatment or other patient management decisions. A negative result may occur with  improper specimen collection/handling, submission of  specimen other than nasopharyngeal swab, presence of viral mutation(s) within the areas targeted by this assay, and inadequate number of viral copies(<138 copies/mL). A negative result must be combined with clinical observations, patient history, and epidemiological information. The expected result is Negative.  Fact Sheet for Patients:  BloggerCourse.com  Fact Sheet for Healthcare Providers:  SeriousBroker.it  This test is no t yet approved or cleared by the [place] FDA and  has been authorized for detection and/or diagnosis of SARS-CoV-2 by FDA under an Emergency Use Authorization (EUA). This EUA will remain  in effect (meaning this test can be used) for the duration of the COVID-19 declaration under Section 564(b)(1) of the Act, 21 U.S.C.section 360bbb-3(b)(1), unless the authorization is terminated  or revoked sooner.       Influenza A by PCR NEGATIVE NEGATIVE Final   Influenza B by PCR NEGATIVE NEGATIVE Final    Comment: (NOTE) The Xpert Xpress SARS-CoV-2/FLU/RSV plus assay is intended as an aid in the diagnosis of influenza from Nasopharyngeal swab specimens and should not be used as a sole basis for treatment. Nasal washings and aspirates are unacceptable for Xpert Xpress SARS-CoV-2/FLU/RSV testing.  Fact Sheet for Patients: BloggerCourse.com  Fact Sheet for Healthcare Providers: SeriousBroker.it  This test is not yet approved or cleared by the [place] FDA and has been authorized for detection and/or diagnosis of SARS-CoV-2 by FDA under an Emergency Use Authorization (EUA). This EUA will remain in effect (meaning this test can be used) for the duration of the COVID-19 declaration under Section 564(b)(1) of the Act, 21 U.S.C. section 360bbb-3(b)(1), unless the authorization is terminated or revoked.     Resp Syncytial Virus by PCR NEGATIVE NEGATIVE Final     Comment: (NOTE) Fact Sheet for Patients: BloggerCourse.com  Fact Sheet for Healthcare Providers: SeriousBroker.it  This test is not  yet approved or cleared by the [place] and has been authorized for detection and/or diagnosis of SARS-CoV-2 by FDA under an Emergency Use Authorization (EUA). This EUA will remain in effect (meaning this test can be used) for the duration of the COVID-19 declaration under Section 564(b)(1) of the Act, 21 U.S.C. section 360bbb-3(b)(1), unless the authorization is terminated or revoked.  Performed at [facility], [address] [place], [place] [address]   Blood Culture ID Panel (Reflexed)     Status: Abnormal   Collection Time: 08/26/23  5:38 PM  Result Value Ref Range Status   Enterococcus faecalis NOT DETECTED NOT DETECTED Final   Enterococcus Faecium NOT DETECTED NOT DETECTED Final   Listeria monocytogenes NOT DETECTED NOT DETECTED Final   Staphylococcus species DETECTED (A) NOT DETECTED Final    Comment: CRITICAL RESULT CALLED TO, READ BACK BY AND VERIFIED WITH: MEGAN NOTCH ON 08/27/23 AT 0757 QSD    Staphylococcus aureus (BCID) NOT DETECTED NOT DETECTED Final   Staphylococcus epidermidis DETECTED (A) NOT DETECTED Final    Comment: Methicillin (oxacillin) resistant coagulase negative staphylococcus. Possible blood culture contaminant (unless isolated from more than one blood culture draw or clinical case suggests pathogenicity). No antibiotic treatment is indicated for blood  culture contaminants. CRITICAL RESULT CALLED TO, READ BACK BY AND VERIFIED WITH: MEGAN NOTCH ON 08/27/23 AT 0757 QSD    Staphylococcus lugdunensis NOT DETECTED NOT DETECTED Final   Streptococcus species NOT DETECTED NOT DETECTED Final   Streptococcus agalactiae NOT DETECTED NOT DETECTED Final   Streptococcus pneumoniae NOT DETECTED NOT DETECTED Final   Streptococcus pyogenes NOT DETECTED NOT DETECTED  Final   A.calcoaceticus-baumannii NOT DETECTED NOT DETECTED Final   Bacteroides fragilis NOT DETECTED NOT DETECTED Final   Enterobacterales NOT DETECTED NOT DETECTED Final   Enterobacter cloacae complex NOT DETECTED NOT DETECTED Final   Escherichia coli NOT DETECTED NOT DETECTED Final   Klebsiella aerogenes NOT DETECTED NOT DETECTED Final   Klebsiella oxytoca NOT DETECTED NOT DETECTED Final   Klebsiella pneumoniae NOT DETECTED NOT DETECTED Final   Proteus species NOT DETECTED NOT DETECTED Final   Salmonella species NOT DETECTED NOT DETECTED Final   Serratia marcescens NOT DETECTED NOT DETECTED Final   Haemophilus influenzae NOT DETECTED NOT DETECTED Final   Neisseria meningitidis NOT DETECTED NOT DETECTED Final   Pseudomonas aeruginosa NOT DETECTED NOT DETECTED Final   Stenotrophomonas maltophilia NOT DETECTED NOT DETECTED Final   Candida albicans NOT DETECTED NOT DETECTED Final   Candida auris NOT DETECTED NOT DETECTED Final   Candida glabrata NOT DETECTED NOT DETECTED Final   Candida krusei NOT DETECTED NOT DETECTED Final   Candida parapsilosis NOT DETECTED NOT DETECTED Final   Candida tropicalis NOT DETECTED NOT DETECTED Final   Cryptococcus neoformans/gattii NOT DETECTED NOT DETECTED Final   Methicillin resistance mecA/C DETECTED (A) NOT DETECTED Final    Comment: CRITICAL RESULT CALLED TO, READ BACK BY AND VERIFIED WITH: MEGAN NOTCH ON 08/27/23 AT 0757 QSD Performed at [facility], [address] [place], [place] [address]   Urine Culture     Status: None   Collection Time: 08/27/23  3:20 AM   Specimen: Urine, Random  Result Value Ref Range Status   Specimen Description   Final    URINE, RANDOM Performed at [facility], [address] [place], [place] [address]    Special Requests   Final    NONE Reflexed from [id number] Performed at [facility], [address] [place],  [place] [address]    Culture   Final    NO GROWTH Performed at [facility], [address] [place], [place] [address]    Report Status 08/28/2023 FINAL  Final    Labs: CBC: Recent Labs  Lab 08/26/23 1737 08/28/23 0955 08/30/23 0515 08/31/23 0544 09/01/23 0501  WBC 19.3* 7.5 9.4 9.0 8.3  NEUTROABS 13.5*  --   --   --   --   HGB 13.6 12.4* 9.7* 9.8* 10.3*  HCT 45.6 41.7 32.6* 32.9* 33.7*  MCV 91.8 91.9 92.6 93.2 91.1  PLT 249 156 151 138* 133*   Basic Metabolic Panel: Recent Labs  Lab 08/27/23 1613 08/28/23 0955 08/29/23 0911 08/30/23 0515 08/31/23 0544 09/01/23 0501  NA 155* 153* 152*  --  151* 148*  K 4.1 4.4 4.2  --  4.2 4.1  CL 117* 118* 120*  --  117* 113*  CO2 27 26 25   --  26 28  GLUCOSE 335* 352* 332*  --  241* 153*  BUN 75* 57* 42*  --  29* 33*  CREATININE 1.11 1.06 0.85 0.74 0.71 0.75  CALCIUM 9.3 8.9 8.3*  --  8.1* 8.4*  MG  --   --  2.3  --  2.2 2.3  PHOS  --   --   --   --  2.5 2.7   Liver Function Tests: Recent Labs  Lab 08/26/23 1737 08/28/23 0955 08/29/23 0911  AST 32 15 19  ALT 63* 34 29  ALKPHOS 131* 94 114  BILITOT 0.6 0.7 0.5  PROT 8.2* 6.4* 6.6  ALBUMIN 3.1* 2.5* 2.4*   CBG: Recent Labs  Lab 08/31/23 1613 08/31/23 1958 09/01/23 0033 09/01/23 0428 09/01/23 0829  GLUCAP 199* 164* 111* 133* 181*    Discharge time spent: greater than 30 minutes.  Signed: Marrion Coy, MD [facility] 09/01/2023

## 2023-10-11 DEATH — deceased

## 2024-03-11 IMAGING — DX DG ABD PORTABLE 1V
1 series · 1 of 1 positions shown · non-contrast
Comparison: None Available.

CLINICAL DATA: Check jejunostomy catheter

EXAM:
PORTABLE ABDOMEN - 1 VIEW

[abdomen supine]
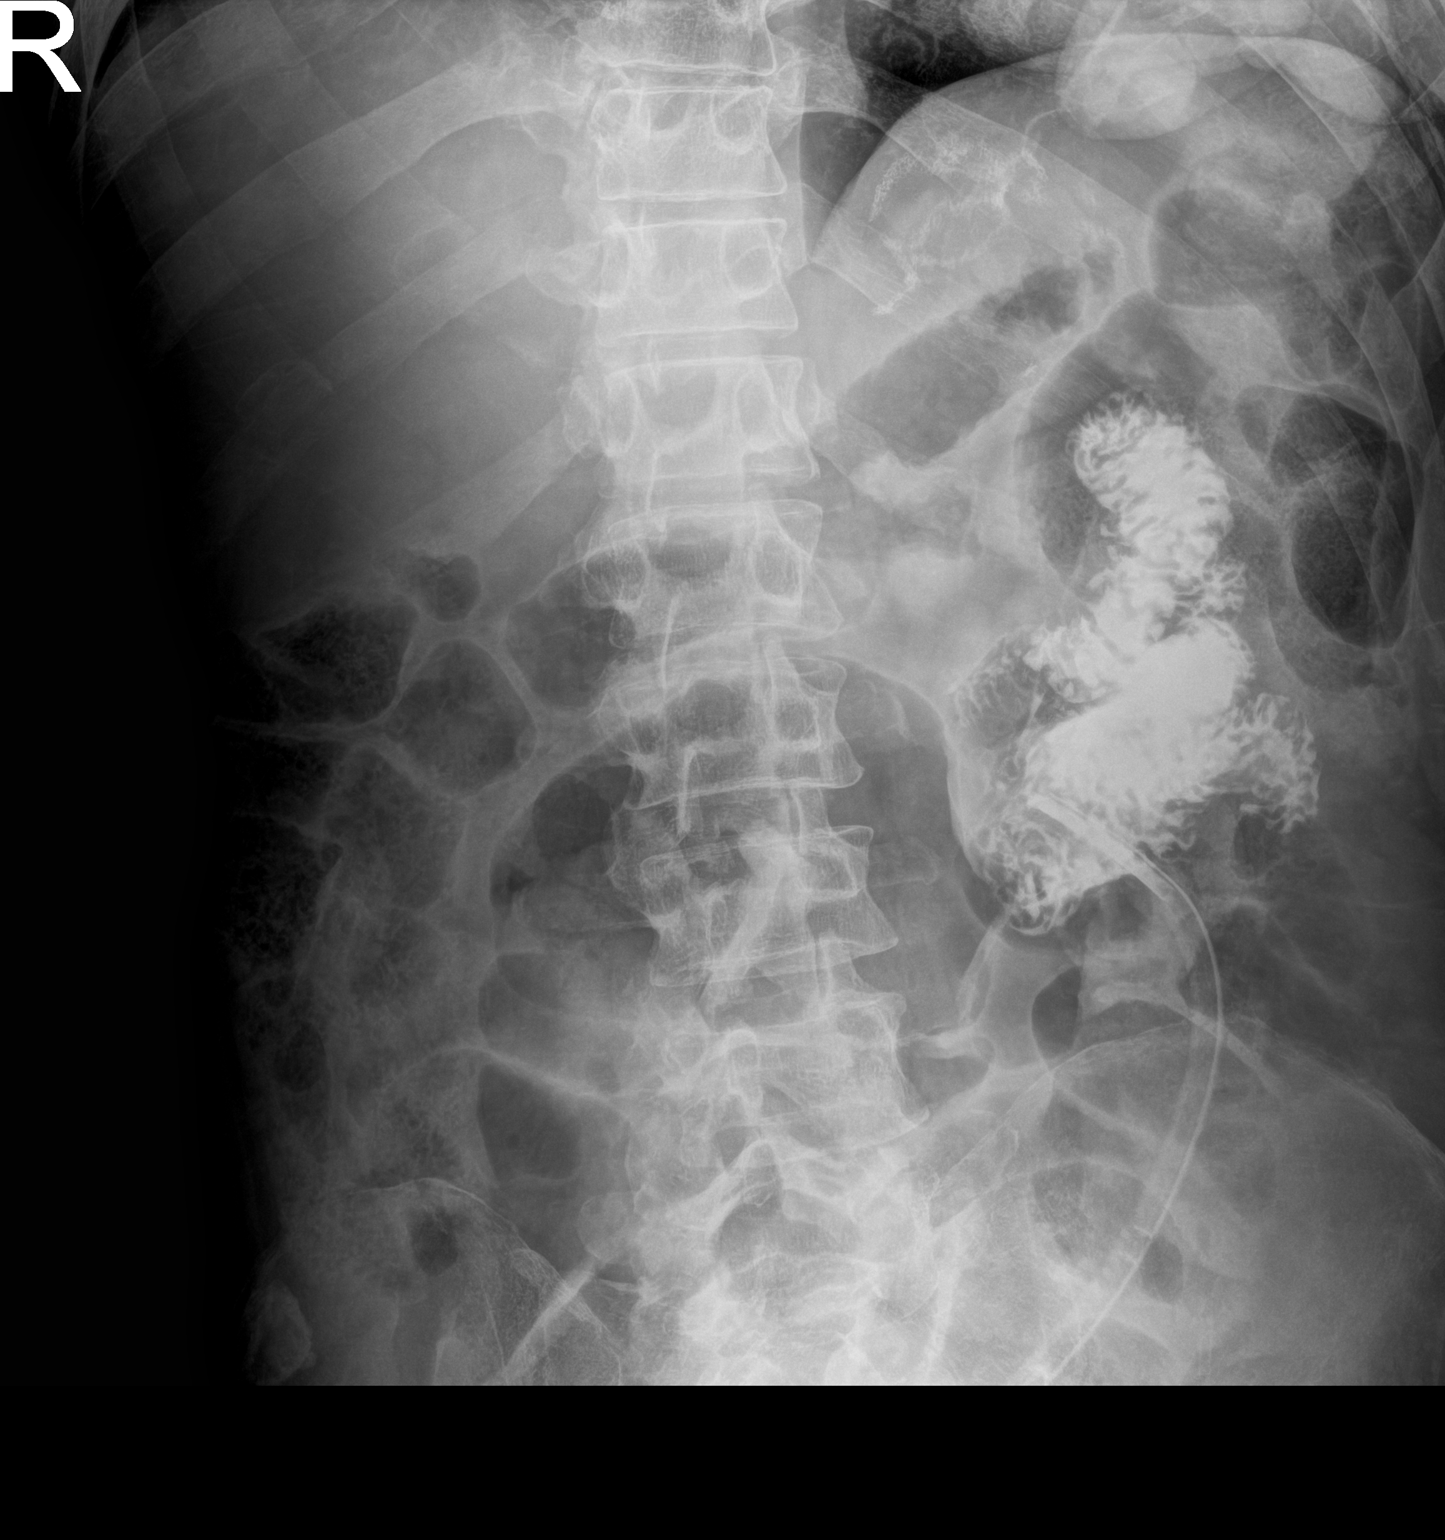

[1 of 1 positions shown; findings below may reference images not displayed]

FINDINGS: Contrast material was injected through an indwelling jejunostomy
tube shows for flow into the jejunum. No obstructive changes or free
air are seen. Postsurgical changes in the left upper quadrant are
noted.
IMPRESSION: Jejunostomy catheter in satisfactory position.
